# Patient Record
Sex: Male | Born: 1955 | Race: Black or African American | Hispanic: No | Marital: Single | Smoking: Current every day smoker
Health system: Southern US, Community
[De-identification: ages and names within clinical notes are randomized; demographics above are authoritative.]

## PROBLEM LIST (undated history)

## (undated) DIAGNOSIS — M79606 Pain in leg, unspecified: Secondary | ICD-10-CM

## (undated) DIAGNOSIS — M25579 Pain in unspecified ankle and joints of unspecified foot: Secondary | ICD-10-CM

## (undated) DIAGNOSIS — M76829 Posterior tibial tendinitis, unspecified leg: Secondary | ICD-10-CM

## (undated) DIAGNOSIS — M79673 Pain in unspecified foot: Secondary | ICD-10-CM

## (undated) DIAGNOSIS — G8929 Other chronic pain: Secondary | ICD-10-CM

## (undated) HISTORY — PX: LEG SURGERY: SHX1003

---

## 2014-01-05 ENCOUNTER — Emergency Department (HOSPITAL_COMMUNITY)
Admission: EM | Admit: 2014-01-05 | Discharge: 2014-01-05 | Disposition: A | Discharge status: Home / Self Care | Payer: Self-pay | Attending: Emergency Medicine | Admitting: Emergency Medicine

## 2014-01-05 ENCOUNTER — Emergency Department (HOSPITAL_COMMUNITY): Payer: Self-pay

## 2014-01-05 ENCOUNTER — Encounter (HOSPITAL_COMMUNITY): Payer: Self-pay | Admitting: Emergency Medicine

## 2014-01-05 DIAGNOSIS — Y9389 Activity, other specified: Secondary | ICD-10-CM | POA: Insufficient documentation

## 2014-01-05 DIAGNOSIS — S46909A Unspecified injury of unspecified muscle, fascia and tendon at shoulder and upper arm level, unspecified arm, initial encounter: Secondary | ICD-10-CM | POA: Insufficient documentation

## 2014-01-05 DIAGNOSIS — Y929 Unspecified place or not applicable: Secondary | ICD-10-CM | POA: Insufficient documentation

## 2014-01-05 DIAGNOSIS — F17200 Nicotine dependence, unspecified, uncomplicated: Secondary | ICD-10-CM | POA: Insufficient documentation

## 2014-01-05 DIAGNOSIS — IMO0002 Reserved for concepts with insufficient information to code with codable children: Secondary | ICD-10-CM | POA: Insufficient documentation

## 2014-01-05 DIAGNOSIS — S4980XA Other specified injuries of shoulder and upper arm, unspecified arm, initial encounter: Secondary | ICD-10-CM | POA: Insufficient documentation

## 2014-01-05 DIAGNOSIS — Z791 Long term (current) use of non-steroidal anti-inflammatories (NSAID): Secondary | ICD-10-CM | POA: Insufficient documentation

## 2014-01-05 DIAGNOSIS — S0993XA Unspecified injury of face, initial encounter: Secondary | ICD-10-CM | POA: Insufficient documentation

## 2014-01-05 DIAGNOSIS — S199XXA Unspecified injury of neck, initial encounter: Secondary | ICD-10-CM

## 2014-01-05 DIAGNOSIS — M25512 Pain in left shoulder: Secondary | ICD-10-CM

## 2014-01-05 MED ORDER — HYDROCODONE-ACETAMINOPHEN 5-325 MG PO TABS: ORAL_TABLET | ORAL | Status: DC

## 2014-01-05 MED ORDER — ACETAMINOPHEN 325 MG PO TABS
650.0000 mg | ORAL_TABLET | Freq: Once | ORAL | Status: AC
  Administered 2014-01-05: 650 mg via ORAL
  Filled 2014-01-05: qty 2

## 2014-01-05 MED ORDER — NAPROXEN 500 MG PO TABS: 500.0000 mg | ORAL_TABLET | Freq: Two times a day (BID) | ORAL | Status: DC

## 2014-01-05 NOTE — ED Notes (Signed)
Pt c/o neck pain since weedeating on Wednesday, has tried heat packs on neck

## 2014-01-05 NOTE — Discharge Instructions (Signed)
Shoulder Pain The shoulder is the joint that connects your arm to your body. Muscles and band-like tissues that connect bones to muscles (tendons) hold the joint together. Shoulder pain is felt if an injury or medical problem affects one or more parts of the shoulder. HOME CARE   Put ice on the sore area.  Put ice in a plastic bag.  Place a towel between your skin and the bag.  Leave the ice on for 15-20 minutes, 03-04 times a day for the first 2 days.  Stop using cold packs if they do not help with the pain.  If you were given something to keep your shoulder from moving (sling, shoulder immobilizer), wear it as told. Only take it off to shower or bathe.  Move your arm as little as possible, but keep your hand moving to prevent puffiness (swelling).  Squeeze a soft ball or foam pad as much as possible to help prevent swelling.  Take medicine as told by your doctor. GET HELP RIGHT AWAY IF:   Your arm, hand, or fingers are numb or tingling.  Your arm, hand, or fingers are puffy (swollen), painful, or turn white or blue.  You have more pain.  You have progressing new pain in your arm, hand, or fingers.  Your hand or fingers get cold.  Your medicine does not help lessen your pain. MAKE SURE YOU:   Understand these instructions.  Will watch your condition.  Will get help right away if you are not doing well or get worse. Document Released: 01/10/2008 Document Revised: 04/17/2012 Document Reviewed: 02/05/2012 [employer] Patient Information 2014 [employer].  

## 2014-01-05 NOTE — ED Notes (Signed)
Weed eating x2 days ago,weed eater" kicked back", now has swollen left neck, tender upon palpation, painful when raising arm left arm at times

## 2014-01-08 NOTE — ED Provider Notes (Signed)
CSN: [id number]     Arrival date & time 01/05/14  1056 History   First MD Initiated Contact with Patient 01/05/14 1116     Chief Complaint  Patient presents with  . Neck Pain     (Consider location/radiation/quality/duration/timing/severity/associated sxs/prior Treatment) Patient is a 58 y.o. male presenting with neck pain. The history is provided by the patient.  Neck Pain Pain location:  L side Quality:  Aching and stiffness Stiffness is present:  All day Pain radiates to:  L shoulder Pain severity:  Moderate Pain is:  Same all the time Onset quality:  Sudden Duration:  5 days Timing:  Constant Progression:  Improving Chronicity:  New Context: recent injury   Context comment:  Pt reports sudden onset of left neck and shoulder pain after pulling on a "weedeater" to try to crank it and he states it "kicked back" causing a jerking to his arm.   Relieved by:  Heat Exacerbated by: movement of the left arm  Ineffective treatments:  None tried Associated symptoms: no bladder incontinence, no bowel incontinence, no chest pain, no fever, no headaches, no leg pain, no numbness, no paresis, no syncope, no tingling, no visual change and no weakness   Associated symptoms comment:  Denies shortness of breath, dizziness, syncope, or chest pain   History reviewed. No pertinent past medical history. History reviewed. No pertinent past surgical history. History reviewed. No pertinent family history. History  Substance Use Topics  . Smoking status: Current Every Day Smoker    Types: Cigarettes  . Smokeless tobacco: Not on file  . Alcohol Use: Yes    Review of Systems  Constitutional: Negative for fever and chills.  HENT: Negative for facial swelling, sore throat and trouble swallowing.   Respiratory: Negative for cough, chest tightness, shortness of breath and wheezing.   Cardiovascular: Negative for chest pain and syncope.  Gastrointestinal: Negative for bowel incontinence.    Genitourinary: Negative for bladder incontinence, dysuria and difficulty urinating.  Musculoskeletal: Positive for neck pain. Negative for arthralgias, back pain, gait problem and joint swelling.  Skin: Negative for color change, rash and wound.  Neurological: Negative for dizziness, tingling, syncope, weakness, numbness and headaches.  All other systems reviewed and are negative.     Allergies  Aspirin  Home Medications   Prior to Admission medications   Medication Sig Start Date End Date Taking? Authorizing Provider  HYDROcodone-acetaminophen (NORCO/VICODIN) 5-325 MG per tablet Take one-two tabs po q 4-6 hrs prn pain 01/05/14   Balinda Heacock L. Kamri Gotsch, PA-C  naproxen (NAPROSYN) 500 MG tablet Take 1 tablet (500 mg total) by mouth 2 (two) times daily. Take with food 01/05/14   Keauna Brasel L. Princes Finger, PA-C   BP 112/72  Pulse 68  Temp(Src) 98.6 F (37 C) (Oral)  Resp 20  Ht 5\' 11"  (1.803 m)  Wt 155 lb (70.308 kg)  BMI 21.63 kg/m2  SpO2 100% Physical Exam  Nursing note and vitals reviewed. Constitutional: He is oriented to person, place, and time. He appears well-developed and well-nourished. No distress.  HENT:  Head: Normocephalic and atraumatic.  Mouth/Throat: Oropharynx is clear and moist.  Eyes: EOM are normal. Pupils are equal, round, and reactive to light.  Neck: Phonation normal. Normal carotid pulses and no JVD present. Muscular tenderness present. No spinous process tenderness present. Carotid bruit is not present. No rigidity. No erythema and normal range of motion present. No Brudzinski's sign and no Kernig's sign noted. No thyromegaly present.    ttp of the left medial  clavicle, cervical paraspinal muscles and along the left trapezius muscle.  Grip strength is strong and equal bilaterally.  Distal sensation intact,  CR < 2 sec.  Pain to the left neck is reproduced with rotation and palpation.    Cardiovascular: Normal rate, regular rhythm, normal heart sounds and intact distal  pulses.   No murmur heard. Pulmonary/Chest: Effort normal and breath sounds normal. No respiratory distress. He exhibits no tenderness.  Abdominal: Soft. He exhibits no distension. There is no tenderness. There is no rebound.  Musculoskeletal: He exhibits tenderness. He exhibits no edema.       Cervical back: He exhibits tenderness. He exhibits normal range of motion, no bony tenderness, no swelling, no deformity, no spasm and normal pulse.  See neck exam  Lymphadenopathy:    He has no cervical adenopathy.  Neurological: He is alert and oriented to person, place, and time. He has normal strength. No sensory deficit. He exhibits normal muscle tone. Coordination normal.  Reflex Scores:      Tricep reflexes are 2+ on the right side and 2+ on the left side.      Bicep reflexes are 2+ on the right side and 2+ on the left side. Skin: Skin is warm and dry.    ED Course  Procedures (including critical care time) Labs Review Labs Reviewed - No data to display  Imaging Review Dg Shoulder Left  01/05/2014   CLINICAL DATA:  Pain  EXAM: LEFT SHOULDER - 2+ VIEW  COMPARISON:  None.  FINDINGS: Frontal, Y scapular, and axillary images were obtained. There is a small calcification just inferior to the inferior glenoid which may represent a small avulsion type injury. No other evidence of potential fracture. No dislocation. Joint spaces appear intact.  IMPRESSION: Small calcification inferior to the inferior glenoid, concerning for a small avulsion. No other evidence of fracture. Joint spaces appear intact.   Electronically Signed   By: Bretta BangWilliam  Woodruff M.D.   On: 01/05/2014 12:09    EKG Interpretation None      MDM   Final diagnoses:  Shoulder pain, left    Pt with sudden onset of left neck and shoulder pain after work related injury.  Denies CP, dyspnea, numbness, tingling of the UE's, or diaphoresis.  Pt is reproduced with plap and rotation of the neck.  Likely musculoskeletal.  NV intact.  Pt  agrees to symptomatic tx and given strict return precautions and he agrees to plan.  VSS.  Pt appears stable for d/c.  Advised to f/u with PMD as well.      Elliott Lasecki L. Trisha Mangleriplett, PA-C 01/08/14 2033

## 2014-01-12 NOTE — ED Provider Notes (Signed)
Medical screening examination/treatment/procedure(s) were performed by non-physician practitioner and as supervising physician I was immediately available for consultation/collaboration.   EKG Interpretation None       Taiwan Millon R. Joanell Cressler, MD 01/12/14 2336 

## 2014-03-04 ENCOUNTER — Encounter (HOSPITAL_COMMUNITY): Payer: Self-pay | Admitting: Emergency Medicine

## 2014-03-04 ENCOUNTER — Emergency Department (HOSPITAL_COMMUNITY): Payer: Self-pay

## 2014-03-04 ENCOUNTER — Emergency Department (HOSPITAL_COMMUNITY)
Admission: EM | Admit: 2014-03-04 | Discharge: 2014-03-04 | Disposition: A | Discharge status: Home / Self Care | Payer: Self-pay | Attending: Emergency Medicine | Admitting: Emergency Medicine

## 2014-03-04 DIAGNOSIS — Z8781 Personal history of (healed) traumatic fracture: Secondary | ICD-10-CM | POA: Insufficient documentation

## 2014-03-04 DIAGNOSIS — F17200 Nicotine dependence, unspecified, uncomplicated: Secondary | ICD-10-CM | POA: Insufficient documentation

## 2014-03-04 DIAGNOSIS — M79605 Pain in left leg: Secondary | ICD-10-CM

## 2014-03-04 DIAGNOSIS — Z791 Long term (current) use of non-steroidal anti-inflammatories (NSAID): Secondary | ICD-10-CM | POA: Insufficient documentation

## 2014-03-04 DIAGNOSIS — M79609 Pain in unspecified limb: Secondary | ICD-10-CM | POA: Insufficient documentation

## 2014-03-04 MED ORDER — HYDROCODONE-ACETAMINOPHEN 5-325 MG PO TABS: 2.0000 | ORAL_TABLET | ORAL | Status: DC | PRN

## 2014-03-04 NOTE — ED Notes (Signed)
Patient c/o left leg pain that started yesterday. Patient c/o aching that radiates from knee into ankle. Denies any know injury. Patient reports using muscle cream and using hydrocodone that he had left over from shoulder injury with no relief.

## 2014-03-04 NOTE — ED Provider Notes (Signed)
CSN: [id number]     Arrival date & time 03/04/14  1230 History  This chart was scribed for Corey Hutching, MD by Roxy Cedar, ED Scribe. This patient was seen in room APA11/APA11 and the patient's care was started at 1:56 PM.  Chief Complaint  Patient presents with  . Leg Pain   The history is provided by the patient. No language interpreter was used.    HPI Comments: Zerek Lusby is a 58 y.o. male who presents to the Emergency Department complaining of moderate left calf pain, that radiates down to the dorsum of his left foot onset yesterday morning.  He states his LLE is "sore" and that it "hurts". Patient has prior history of a complex fracture in left lower leg from 25 years ago. Patient denies any more repeated injuries.  History reviewed. No pertinent past medical history. Past Surgical History  Procedure Laterality Date  . Leg surgery Left    History reviewed. No pertinent family history. History  Substance Use Topics  . Smoking status: Current Every Day Smoker -- 0.50 packs/day for 25 years    Types: Cigarettes  . Smokeless tobacco: Never Used  . Alcohol Use: Yes     Comment: approx 10-40 oz beers a week     Review of Systems  A complete 10 system review of systems was obtained and all systems are negative except as noted in the HPI and PMH.   Allergies  Aspirin  Home Medications   Prior to Admission medications   Medication Sig Start Date End Date Taking? Authorizing Provider  HYDROcodone-acetaminophen (NORCO/VICODIN) 5-325 MG per tablet Take one-two tabs po q 4-6 hrs prn pain 01/05/14  Yes Tammy L. Triplett, PA-C  naproxen sodium (ANAPROX) 220 MG tablet Take 440 mg by mouth daily as needed (pain).   Yes Historical Provider, MD  HYDROcodone-acetaminophen (NORCO) 5-325 MG per tablet Take 2 tablets by mouth every 4 (four) hours as needed. 03/04/14   Corey Hutching, MD   Triage Vitals: BP 150/84  Pulse 98  Temp(Src) 99.2 F (37.3 C) (Oral)  Resp 18  Ht 5\' 11"  (1.803 m)   Wt 150 lb (68.04 kg)  BMI 20.93 kg/m2  SpO2 100% Physical Exam  Nursing note and vitals reviewed. Constitutional: He is oriented to person, place, and time. He appears well-developed and well-nourished.  HENT:  Head: Normocephalic and atraumatic.  Eyes: Conjunctivae and EOM are normal. Pupils are equal, round, and reactive to light.  Neck: Normal range of motion. Neck supple.  Cardiovascular: Normal rate, regular rhythm and normal heart sounds.   Pulmonary/Chest: Effort normal and breath sounds normal.  Abdominal: Soft. Bowel sounds are normal.  Musculoskeletal: Normal range of motion. He exhibits no tenderness.  Left leg is nontender.  No calf tenderness.  Neurological: He is alert and oriented to person, place, and time.  Skin: Skin is warm and dry.  Psychiatric: He has a normal mood and affect. His behavior is normal.    ED Course  Procedures (including critical care time)  DIAGNOSTIC STUDIES: Oxygen Saturation is 100% on RA, normal by my interpretation.    COORDINATION OF CARE: 1:59 PM- Discussed plans to order diagnostic xray of leg and lab work. Pt advised of plan for treatment and pt agrees.     EKG Interpretation None      MDM   Final diagnoses:  Pain of left lower extremity    Plain films of left tib-fib and left foot negative for fracture. No evidence of a deep  venous thrombosis.  Rx Norco.  I personally performed the services described in this documentation, which was scribed in my presence. The recorded information has been reviewed and is accurate.    Corey HutchingBrian Margart Zemanek, MD 03/04/14 1538

## 2014-03-04 NOTE — Discharge Instructions (Signed)
X-ray showed no fractured bones. Ice pack. Pain medicine. Can also take ibuprofen. Followup with orthopedic doctor if not improving. Phone number given.

## 2014-04-07 ENCOUNTER — Ambulatory Visit (INDEPENDENT_AMBULATORY_CARE_PROVIDER_SITE_OTHER): Payer: Self-pay | Admitting: Orthopedic Surgery

## 2014-04-07 ENCOUNTER — Other Ambulatory Visit: Payer: Self-pay | Admitting: *Deleted

## 2014-04-07 DIAGNOSIS — M6789 Other specified disorders of synovium and tendon, multiple sites: Secondary | ICD-10-CM

## 2014-04-07 DIAGNOSIS — M19079 Primary osteoarthritis, unspecified ankle and foot: Secondary | ICD-10-CM

## 2014-04-07 DIAGNOSIS — M76829 Posterior tibial tendinitis, unspecified leg: Secondary | ICD-10-CM

## 2014-04-07 MED ORDER — HYDROCODONE-ACETAMINOPHEN 10-325 MG PO TABS: 1.0000 | ORAL_TABLET | Freq: Four times a day (QID) | ORAL | Status: DC | PRN

## 2014-04-07 NOTE — Patient Instructions (Signed)
Use these crutches for 1 week AFTER you get the foot orthotics from [place]

## 2014-04-09 ENCOUNTER — Encounter: Payer: Self-pay | Admitting: Orthopedic Surgery

## 2014-04-09 DIAGNOSIS — M76829 Posterior tibial tendinitis, unspecified leg: Secondary | ICD-10-CM | POA: Insufficient documentation

## 2014-04-09 DIAGNOSIS — M19079 Primary osteoarthritis, unspecified ankle and foot: Secondary | ICD-10-CM | POA: Insufficient documentation

## 2014-04-09 NOTE — Progress Notes (Signed)
New patient referral from the emergency room  Chief complaint pain left foot  The patient had open treatment internal fixation of his left ankle several years ago approximately 25 to be exact and presents with painful ambulation with pain along the medial aspect of the ankle malleolus and foot and inability to bear full weight with no history of recent trauma and symptoms present for approximately 2-3 weeks  Review of systems see the record documents  The past, family history and social history have been reviewed and are recorded in the corresponding sections of epic     General the patient grooming and hygiene are acceptable. He has a very small frame skin he then ectomorphic body habitus. Oriented x3 Mood and affect normal Ambulation he ambulates with assistive devices partial weightbearing on the left foot Inspection of the is severe pes planus of his left foot but he can do a sequential single leg heel rise while holding onto a table with no weakness. This pes planus is flexible at the heel. His tenderness is along the posterior tibial tendon. The medial malleolus and lateral malleolus nontender incisions are well-healed and he does have some tenderness at the medial corner of the ankle joint. (See x-ray) Full range of motion All joints are stable Motor exam is normal Skin clean dry and intact  Cardiovascular exam is normal Sensory exam normal   X-ray show internal fixation medial and lateral malleolus with increased arthritic changes noted at the medial portion of the ankle joint. This is the area of maximal tenderness for the patient.  Impression osteoarthritis ankle Posterior tibial tendinitis  The patient is uninsured he cannot afford a Cam Walker I've ordered orthotics for him and gave him pain medication and told to continue using his crutches for one more week after he uses the orthotics and then continue orthotic use.  If he needs additional care he will need referral to  a foot and ankle specialist.

## 2014-05-22 ENCOUNTER — Other Ambulatory Visit: Payer: Self-pay | Admitting: *Deleted

## 2014-05-22 ENCOUNTER — Telehealth: Payer: Self-pay | Admitting: Orthopedic Surgery

## 2014-05-22 MED ORDER — HYDROCODONE-ACETAMINOPHEN 10-325 MG PO TABS: 1.0000 | ORAL_TABLET | Freq: Four times a day (QID) | ORAL | Status: DC | PRN

## 2014-05-22 NOTE — Telephone Encounter (Signed)
Patient is calling asking for a refill on pain medicaiton HYDROcodone-acetaminophen (NORCO) 10-325 MG per tablet please advise?

## 2014-05-25 ENCOUNTER — Other Ambulatory Visit: Payer: Self-pay | Admitting: *Deleted

## 2014-05-25 ENCOUNTER — Other Ambulatory Visit: Payer: Self-pay | Admitting: Orthopedic Surgery

## 2014-05-25 MED ORDER — HYDROCODONE-ACETAMINOPHEN 10-325 MG PO TABS: 1.0000 | ORAL_TABLET | Freq: Four times a day (QID) | ORAL | Status: DC | PRN

## 2014-05-25 NOTE — Telephone Encounter (Signed)
Per Dr Romeo AppleHarrison  Can not have more narcotic meds from me   Refer to foot ankle specialist if hes having trouble   i recommend tylenol arthritis   Called patient, unavailable, left message with mother to have patient call office

## 2014-06-29 ENCOUNTER — Inpatient Hospital Stay (HOSPITAL_COMMUNITY)
Admission: EM | Admit: 2014-06-29 | Discharge: 2014-08-07 | DRG: 299 | Disposition: E | Payer: Medicaid Other | Attending: Pulmonary Disease | Admitting: Pulmonary Disease

## 2014-06-29 ENCOUNTER — Encounter (HOSPITAL_COMMUNITY): Payer: Self-pay | Admitting: Emergency Medicine

## 2014-06-29 DIAGNOSIS — G919 Hydrocephalus, unspecified: Secondary | ICD-10-CM | POA: Diagnosis not present

## 2014-06-29 DIAGNOSIS — R32 Unspecified urinary incontinence: Secondary | ICD-10-CM | POA: Diagnosis not present

## 2014-06-29 DIAGNOSIS — G9340 Encephalopathy, unspecified: Secondary | ICD-10-CM | POA: Insufficient documentation

## 2014-06-29 DIAGNOSIS — N1330 Unspecified hydronephrosis: Secondary | ICD-10-CM | POA: Diagnosis not present

## 2014-06-29 DIAGNOSIS — N182 Chronic kidney disease, stage 2 (mild): Secondary | ICD-10-CM | POA: Diagnosis present

## 2014-06-29 DIAGNOSIS — R630 Anorexia: Secondary | ICD-10-CM | POA: Diagnosis present

## 2014-06-29 DIAGNOSIS — J690 Pneumonitis due to inhalation of food and vomit: Secondary | ICD-10-CM | POA: Diagnosis not present

## 2014-06-29 DIAGNOSIS — D509 Iron deficiency anemia, unspecified: Secondary | ICD-10-CM | POA: Diagnosis present

## 2014-06-29 DIAGNOSIS — Z515 Encounter for palliative care: Secondary | ICD-10-CM

## 2014-06-29 DIAGNOSIS — I82402 Acute embolism and thrombosis of unspecified deep veins of left lower extremity: Secondary | ICD-10-CM

## 2014-06-29 DIAGNOSIS — I129 Hypertensive chronic kidney disease with stage 1 through stage 4 chronic kidney disease, or unspecified chronic kidney disease: Secondary | ICD-10-CM | POA: Diagnosis present

## 2014-06-29 DIAGNOSIS — E43 Unspecified severe protein-calorie malnutrition: Secondary | ICD-10-CM | POA: Diagnosis present

## 2014-06-29 DIAGNOSIS — E876 Hypokalemia: Secondary | ICD-10-CM | POA: Diagnosis not present

## 2014-06-29 DIAGNOSIS — D61818 Other pancytopenia: Secondary | ICD-10-CM

## 2014-06-29 DIAGNOSIS — G8191 Hemiplegia, unspecified affecting right dominant side: Secondary | ICD-10-CM | POA: Diagnosis not present

## 2014-06-29 DIAGNOSIS — Z886 Allergy status to analgesic agent status: Secondary | ICD-10-CM | POA: Diagnosis not present

## 2014-06-29 DIAGNOSIS — R000 Tachycardia, unspecified: Secondary | ICD-10-CM | POA: Diagnosis present

## 2014-06-29 DIAGNOSIS — G8929 Other chronic pain: Secondary | ICD-10-CM | POA: Diagnosis present

## 2014-06-29 DIAGNOSIS — T45515A Adverse effect of anticoagulants, initial encounter: Secondary | ICD-10-CM | POA: Diagnosis not present

## 2014-06-29 DIAGNOSIS — R4020 Unspecified coma: Secondary | ICD-10-CM | POA: Diagnosis not present

## 2014-06-29 DIAGNOSIS — N289 Disorder of kidney and ureter, unspecified: Secondary | ICD-10-CM | POA: Insufficient documentation

## 2014-06-29 DIAGNOSIS — F1010 Alcohol abuse, uncomplicated: Secondary | ICD-10-CM | POA: Diagnosis present

## 2014-06-29 DIAGNOSIS — R312 Other microscopic hematuria: Secondary | ICD-10-CM | POA: Diagnosis present

## 2014-06-29 DIAGNOSIS — M79673 Pain in unspecified foot: Secondary | ICD-10-CM | POA: Diagnosis present

## 2014-06-29 DIAGNOSIS — Z66 Do not resuscitate: Secondary | ICD-10-CM | POA: Diagnosis not present

## 2014-06-29 DIAGNOSIS — Z681 Body mass index (BMI) 19 or less, adult: Secondary | ICD-10-CM | POA: Diagnosis not present

## 2014-06-29 DIAGNOSIS — J9690 Respiratory failure, unspecified, unspecified whether with hypoxia or hypercapnia: Secondary | ICD-10-CM | POA: Insufficient documentation

## 2014-06-29 DIAGNOSIS — N179 Acute kidney failure, unspecified: Secondary | ICD-10-CM | POA: Diagnosis present

## 2014-06-29 DIAGNOSIS — R64 Cachexia: Secondary | ICD-10-CM | POA: Diagnosis present

## 2014-06-29 DIAGNOSIS — R40244 Other coma, without documented Glasgow coma scale score, or with partial score reported: Secondary | ICD-10-CM | POA: Diagnosis present

## 2014-06-29 DIAGNOSIS — I8290 Acute embolism and thrombosis of unspecified vein: Secondary | ICD-10-CM

## 2014-06-29 DIAGNOSIS — I82409 Acute embolism and thrombosis of unspecified deep veins of unspecified lower extremity: Secondary | ICD-10-CM | POA: Diagnosis present

## 2014-06-29 DIAGNOSIS — D696 Thrombocytopenia, unspecified: Secondary | ICD-10-CM | POA: Diagnosis present

## 2014-06-29 DIAGNOSIS — R319 Hematuria, unspecified: Secondary | ICD-10-CM

## 2014-06-29 DIAGNOSIS — R634 Abnormal weight loss: Secondary | ICD-10-CM | POA: Diagnosis present

## 2014-06-29 DIAGNOSIS — I615 Nontraumatic intracerebral hemorrhage, intraventricular: Secondary | ICD-10-CM | POA: Diagnosis not present

## 2014-06-29 DIAGNOSIS — J9600 Acute respiratory failure, unspecified whether with hypoxia or hypercapnia: Secondary | ICD-10-CM | POA: Insufficient documentation

## 2014-06-29 DIAGNOSIS — D649 Anemia, unspecified: Secondary | ICD-10-CM

## 2014-06-29 DIAGNOSIS — Z978 Presence of other specified devices: Secondary | ICD-10-CM

## 2014-06-29 DIAGNOSIS — F17210 Nicotine dependence, cigarettes, uncomplicated: Secondary | ICD-10-CM | POA: Diagnosis present

## 2014-06-29 DIAGNOSIS — M79609 Pain in unspecified limb: Secondary | ICD-10-CM

## 2014-06-29 DIAGNOSIS — F17200 Nicotine dependence, unspecified, uncomplicated: Secondary | ICD-10-CM | POA: Insufficient documentation

## 2014-06-29 DIAGNOSIS — I619 Nontraumatic intracerebral hemorrhage, unspecified: Secondary | ICD-10-CM | POA: Diagnosis present

## 2014-06-29 HISTORY — DX: Pain in unspecified ankle and joints of unspecified foot: M25.579

## 2014-06-29 HISTORY — DX: Other chronic pain: G89.29

## 2014-06-29 HISTORY — DX: Posterior tibial tendinitis, unspecified leg: M76.829

## 2014-06-29 HISTORY — DX: Pain in unspecified foot: M79.673

## 2014-06-29 HISTORY — DX: Pain in leg, unspecified: M79.606

## 2014-06-29 LAB — I-STAT CHEM 8, ED
BUN: 24 mg/dL — AB (ref 6–23)
CALCIUM ION: 1.63 mmol/L — AB (ref 1.13–1.30)
CHLORIDE: 103 meq/L (ref 96–112)
Creatinine, Ser: 1.7 mg/dL — ABNORMAL HIGH (ref 0.50–1.35)
Glucose, Bld: 106 mg/dL — ABNORMAL HIGH (ref 70–99)
HCT: 14 % — ABNORMAL LOW (ref 39.0–52.0)
Hemoglobin: 4.8 g/dL — CL (ref 13.0–17.0)
POTASSIUM: 3.3 meq/L — AB (ref 3.7–5.3)
Sodium: 139 mEq/L (ref 137–147)
TCO2: 25 mmol/L (ref 0–100)

## 2014-06-29 LAB — RETICULOCYTES
RBC.: 1.34 MIL/uL — ABNORMAL LOW (ref 4.22–5.81)
RETIC CT PCT: 3.3 % — AB (ref 0.4–3.1)
Retic Count, Absolute: 44.2 10*3/uL (ref 19.0–186.0)

## 2014-06-29 LAB — CBC WITH DIFFERENTIAL/PLATELET
BASOS PCT: 0 % (ref 0–1)
Basophils Absolute: 0 10*3/uL (ref 0.0–0.1)
EOS PCT: 0 % (ref 0–5)
Eosinophils Absolute: 0 10*3/uL (ref 0.0–0.7)
HCT: 12.8 % — ABNORMAL LOW (ref 39.0–52.0)
Hemoglobin: 3.9 g/dL — CL (ref 13.0–17.0)
LYMPHS ABS: 1 10*3/uL (ref 0.7–4.0)
Lymphocytes Relative: 20 % (ref 12–46)
MCH: 27.7 pg (ref 26.0–34.0)
MCHC: 30.5 g/dL (ref 30.0–36.0)
MCV: 90.8 fL (ref 78.0–100.0)
MONO ABS: 0.5 10*3/uL (ref 0.1–1.0)
MONOS PCT: 9 % (ref 3–12)
NEUTROS PCT: 71 % (ref 43–77)
Neutro Abs: 3.6 10*3/uL (ref 1.7–7.7)
PLATELETS: 91 10*3/uL — AB (ref 150–400)
RBC: 1.41 MIL/uL — AB (ref 4.22–5.81)
RDW: 17.7 % — ABNORMAL HIGH (ref 11.5–15.5)
WBC: 5.1 10*3/uL (ref 4.0–10.5)

## 2014-06-29 LAB — URINALYSIS, ROUTINE W REFLEX MICROSCOPIC
BILIRUBIN URINE: NEGATIVE
Glucose, UA: NEGATIVE mg/dL
KETONES UR: NEGATIVE mg/dL
Nitrite: NEGATIVE
Protein, ur: NEGATIVE mg/dL
Specific Gravity, Urine: 1.015 (ref 1.005–1.030)
UROBILINOGEN UA: 1 mg/dL (ref 0.0–1.0)
pH: 5.5 (ref 5.0–8.0)

## 2014-06-29 LAB — RAPID URINE DRUG SCREEN, HOSP PERFORMED
Amphetamines: NOT DETECTED
BARBITURATES: NOT DETECTED
Benzodiazepines: NOT DETECTED
Cocaine: NOT DETECTED
Opiates: POSITIVE — AB
Tetrahydrocannabinol: NOT DETECTED

## 2014-06-29 LAB — LACTATE DEHYDROGENASE: LDH: 168 U/L (ref 94–250)

## 2014-06-29 LAB — MRSA PCR SCREENING: MRSA by PCR: NEGATIVE

## 2014-06-29 LAB — POC OCCULT BLOOD, ED: Fecal Occult Bld: NEGATIVE

## 2014-06-29 LAB — HEPATITIS PANEL, ACUTE
HCV Ab: NEGATIVE
HEP A IGM: NONREACTIVE
HEP B C IGM: NONREACTIVE
Hepatitis B Surface Ag: NEGATIVE

## 2014-06-29 LAB — PROTIME-INR
INR: 1.43 (ref 0.00–1.49)
PROTHROMBIN TIME: 17.6 s — AB (ref 11.6–15.2)

## 2014-06-29 LAB — URINE MICROSCOPIC-ADD ON

## 2014-06-29 LAB — SAVE SMEAR

## 2014-06-29 LAB — ABO/RH: ABO/RH(D): O NEG

## 2014-06-29 LAB — PREPARE RBC (CROSSMATCH)

## 2014-06-29 LAB — HAPTOGLOBIN: HAPTOGLOBIN: 297 mg/dL — AB (ref 45–215)

## 2014-06-29 LAB — HIV ANTIBODY (ROUTINE TESTING W REFLEX): HIV 1&2 Ab, 4th Generation: NONREACTIVE

## 2014-06-29 MED ORDER — SODIUM CHLORIDE 0.9 % IV SOLN: Freq: Once | INTRAVENOUS | Status: DC

## 2014-06-29 MED ORDER — ONDANSETRON HCL 4 MG/2ML IJ SOLN: 4.0000 mg | Freq: Four times a day (QID) | INTRAMUSCULAR | Status: DC | PRN

## 2014-06-29 MED ORDER — ONDANSETRON HCL 4 MG PO TABS: 4.0000 mg | ORAL_TABLET | Freq: Four times a day (QID) | ORAL | Status: DC | PRN

## 2014-06-29 MED ORDER — HYDROCODONE-ACETAMINOPHEN 5-325 MG PO TABS
1.0000 | ORAL_TABLET | Freq: Once | ORAL | Status: DC
Start: 2014-06-29 — End: 2014-06-29

## 2014-06-29 MED ORDER — HYDROCODONE-ACETAMINOPHEN 5-325 MG PO TABS
1.0000 | ORAL_TABLET | ORAL | Status: DC | PRN
  Administered 2014-06-29: 1 via ORAL
  Administered 2014-06-30 – 2014-07-02 (×4): 2 via ORAL
  Filled 2014-06-29 (×4): qty 2
  Filled 2014-06-29: qty 1

## 2014-06-29 MED ORDER — HYDROCODONE-ACETAMINOPHEN 5-325 MG PO TABS
2.0000 | ORAL_TABLET | Freq: Once | ORAL | Status: AC
  Administered 2014-06-29: 2 via ORAL
  Filled 2014-06-29: qty 2

## 2014-06-29 MED ORDER — SODIUM CHLORIDE 0.9 % IJ SOLN
3.0000 mL | Freq: Two times a day (BID) | INTRAMUSCULAR | Status: DC
  Administered 2014-06-29 – 2014-07-04 (×7): 3 mL via INTRAVENOUS

## 2014-06-29 MED ORDER — INFLUENZA VAC SPLIT QUAD 0.5 ML IM SUSY
0.5000 mL | PREFILLED_SYRINGE | INTRAMUSCULAR | Status: AC
  Administered 2014-06-30: 0.5 mL via INTRAMUSCULAR
  Filled 2014-06-29: qty 0.5

## 2014-06-29 MED ORDER — SODIUM CHLORIDE 0.9 % IV SOLN
10.0000 mL/h | Freq: Once | INTRAVENOUS | Status: AC
  Administered 2014-06-29: 10 mL/h via INTRAVENOUS

## 2014-06-29 NOTE — ED Notes (Signed)
Pt ambulated in hallway with Tech and RN. Pt has unsteady gait.

## 2014-06-29 NOTE — H&P (Signed)
Date: 06/30/2014               Patient Name:  Corey Glenn MRN: [id number]  DOB: 1955/09/14 Age / Sex: 58 y.o., male   PCP: No primary care provider on file.         Medical Service: Internal Medicine Teaching Service         Attending Physician: Dr. Earl LagosNischal Narendra, MD    First Contact: Dr. Allena KatzPatel Pager: [phone]  Second Contact: Dr. Cheryll DessertEmokape Pager: [phone]       After Hours (After 5p/  First Contact Pager: [phone]  weekends / holidays): Second Contact Pager: [phone]   Chief Complaint: right leg pain  History of Present Illness: Corey Glenn is a 58 yo man without known pmh who presents to ED with worsening left leg pain. The patient states that this began about a couple of months ago and he was evaluated by an orthopedist/podiatrist that concluded he had increased leg arch and gave him some pain medications. The patient didn't really improve and therefore the conclusion of chronic pain was made and the orthopedist discontinued his narcotic pain medication within the last few days saying the patient needed to establish with a PCP or pain clinic for chronic narcotics. The patient denied any trauma to the area and that all previous testing he can remember for his leg was negative for fracture or DVT. He states he was in his normal health without any complaints up until the leg pain about 2 weeks. But he has noticed that since that time he has had a weight loss of about 15 lbs within the last month. He denied any shortness or breath, dyspnea on exertion, increased fatigue, any other areas of pain or bone pain, no gross hematuria, no dark tarry stools or hematochezia, and no easy bruising or rashes. 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 He has no known radiation exposure, has had no sick contacts, no recent travel, only been hospitalized  for a fractured/broken left ankle about 25+ years ago. He is a longtime smoker about 12-14 pack year smoker and drinks alcohol daily about 1-2 beers, no other drugs.   Meds: No current facility-administered medications for this encounter.   Current Outpatient Prescriptions  Medication Sig Dispense Refill  . Acetaminophen (TYLENOL PO) Take 1-2 tablets by mouth daily as needed (for pain).    Marland Kitchen. HYDROcodone-acetaminophen (NORCO) 10-325 MG per tablet Take 1 tablet by mouth every 6 (six) hours as needed for moderate pain.      Allergies: Allergies as of 06/12/2014 - Review Complete 06/14/2014  Allergen Reaction Noted  . Aspirin Nausea And Vomiting 01/05/2014   Past Medical History  Diagnosis Date  . Chronic foot pain   . Chronic ankle pain     left  . Posterior tibial tendon dysfunction   . Chronic leg pain     left   Past Surgical History  Procedure Laterality Date  . Leg surgery Left     Open treatment internal fixation left ankle 25 years ago   History reviewed. No pertinent family history. History   Social History  . Marital Status: Single    Spouse Name: N/A    Number of Children: N/A  . Years of Education: N/A   Occupational History  . Not on file.   Social History Main  Topics  . Smoking status: Current Every Day Smoker -- 0.50 packs/day for 25 years    Types: Cigarettes  . Smokeless tobacco: Never Used  . Alcohol Use: Yes     Comment: approx 10-40 oz beers a week   . Drug Use: No  . Sexual Activity: Not on file   Other Topics Concern  . Not on file   Social History Narrative    Review of Systems: Pertinent items are noted in HPI.  Physical Exam: Blood pressure 111/66, pulse 72, temperature 97.8 F (36.6 C), temperature source Oral, resp. rate 17, height 5\' 11"  (1.803 m), weight 150 lb (68.04 kg), SpO2 100 %. General: resting in bed, NAD, cachectic HEENT: PERRL, EOMI, no scleral icterus, senile arcus bilaterally, marked pallor, very poor dentition with  many grossly decayed, temporal wasting Cardiac: tachy with regular rhythm, no rubs, murmurs or gallops Pulm: clear to auscultation bilaterally, no crackles, wheezes, or rhonchi, moving normal volumes of air Abd: soft, nontender, nondistended, no hepatosplenomegaly, BS present Ext: warm and well perfused, no pedal edema, left leg slightly larger than right grossly, 2+ dorsalis pedis pulses, large 8x4 cm mobile soft tissue mass on left shoulder over scapula, no petechiae or rashes Neuro: alert and oriented X3, cranial nerves II-XII grossly intact   Lab results: Basic Metabolic Panel:  Recent Labs  16/10/96 0930  NA 139  K 3.3*  CL 103  GLUCOSE 106*  BUN 24*  CREATININE 1.70*   CBC:  Recent Labs  06/20/2014 0921 07/05/2014 0930  WBC 5.1  --   NEUTROABS 3.6  --   HGB 3.9* 4.8*  HCT 12.8* 14.0*  MCV 90.8  --   PLT 91*  --    Anemia Panel:  Recent Labs  06/28/2014 1100  RETICCTPCT 3.3*   Coagulation:  Recent Labs  06/09/2014 1100  LABPROT 17.6*  INR 1.43   Other results: EKG: normal EKG, normal sinus rhythm, there are no previous tracings available for comparison, ST depression in V6 isolated.  Assessment & Plan by Problem:  1. Occlusive Left leg DVT: Pt with large occlusive DVT as per doppler extending from saphenofemoral to peroneal veins. Pt has not had any hx of DVT and apparently had previously negative evaluation per pt report with an orthopedist. The patient has limited intervention options such as thrombolysis or IVC filter given significant newly found anemia. The whole clinical picture is concerning for an underlying malignancy that may have spawned the DVT it doesn't appear to be DIC picture as pt is not acutely bleeding despite profound anemia.  -obtain outside records -consult Hematology for anticoagulation parameters- recs greatly appreciated  2. Acute Anemia: Pt unknown hx and no labs presented with acute Hgb 3.9, MCV 90 therefore likely something acute. No  signs of active bleeding and FOBT in ED was negative.  -admit to stepdown -ldh, haptoglobin, reticulocyts, blood smear -UDS, UA pending -HIV and hepatitis panel, TSH and HgbA1c pending  -2 units PRBCs with repeat CBC with possible need for 3rd unit  3. Pancytopenia: On CBC with differential pt with mild left shift with some metas (5%) occasional myelocytes and bands makes the situation concerning for a bone marrow process.  -Heme/Onc consult recs greatly appreciated  4. AKI vs CKD: pt has not accessed the medical system. Cr 1.63 unknown baseline.  -IVF and PRBC as above -repeat Cmet in AM -risk stratification labs as above  Dispo: Disposition is deferred at this time, awaiting improvement of current medical problems. Anticipated discharge in  approximately 3-4 day(s).   The patient does not have a current PCP (No primary care provider on file.) and [facility].  The patient does not have transportation limitations that hinder transportation to clinic appointments.  Signed: Christen BameNora Duquan Gillooly, MD 06/22/2014, 12:55 PM

## 2014-06-29 NOTE — ED Notes (Signed)
58 yo male with 3 months of left leg pain. Pain begins in the ankle and shoots up the leg. Hx of left leg fracture in three places 1980 (reports no problems.) 6/10 pain.

## 2014-06-29 NOTE — Progress Notes (Signed)
VASCULAR LAB PRELIMINARY  PRELIMINARY  PRELIMINARY  PRELIMINARY  Bilateral lower extremity venous Dopplers completed.    Preliminary report:  There is acute, occlusive DVT noted in the left saphenofemoral junction, common femoral, femoral, popliteal, posterior tibial, and peroneal veins.  There is no propagation noted to the right lower extremity.   Revecca Nachtigal, RVT 07/06/2014, 8:56 AM

## 2014-06-29 NOTE — ED Notes (Signed)
MD at bedside. Admitting  

## 2014-06-29 NOTE — ED Notes (Signed)
Consent signed.

## 2014-06-29 NOTE — ED Notes (Signed)
MD at bedside. 

## 2014-06-29 NOTE — Plan of Care (Signed)
Problem: Phase I Progression Outcomes Goal: Pain controlled with appropriate interventions Outcome: Progressing Goal: OOB as tolerated unless otherwise ordered Outcome: Progressing Goal: Initial discharge plan identified Outcome: Progressing Goal: Voiding-avoid urinary catheter unless indicated Outcome: Completed/Met Date Met:  06/07/2014 Goal: Hemodynamically stable Outcome: Progressing     

## 2014-06-29 NOTE — ED Notes (Signed)
Pt remains monitored by blood pressure, pulse ox, and 12 lead. Pt asked to provide urine specimen pt stated he could not provide one at this time. Pt provided with urinal.

## 2014-06-29 NOTE — ED Provider Notes (Signed)
CSN: [id number]     Arrival date & time 06/13/2014  [id number] History   First MD Initiated Contact with Patient 06/14/2014 0805     Chief Complaint  Patient presents with  . Leg Pain      HPI Pt was seen at 0805. Per pt, c/o gradual onset and persistence of constant acute flair of his chronic left leg "pain" for the past 3 to 4 months. States the pain starts in his foot and ankle and "shoots up my leg." Describes the pain as "aching." Pt has been evaluated in the ED as well as by Ortho Dr. Romeo AppleHarrison for same, and referred to a foot and ankle specialist. Pt states he has not f/u with his Ortho MD nor obtained f/u with the foot and ankle specialist. Pt states he ran out of his hydrocodone and his Ortho MD "won't give me anymore." Pt came to the ED today requesting pain meds. Denies any injury, no fevers, no rash, no focal motor weakness, no tingling/numbness in extremities, no abd pain, no back pain.    No PMD currently Past Medical History  Diagnosis Date  . Chronic foot pain   . Chronic ankle pain     left  . Posterior tibial tendon dysfunction   . Chronic leg pain     left   Past Surgical History  Procedure Laterality Date  . Leg surgery Left     Open treatment internal fixation left ankle 25 years ago    History  Substance Use Topics  . Smoking status: Current Every Day Smoker -- 0.50 packs/day for 25 years    Types: Cigarettes  . Smokeless tobacco: Never Used  . Alcohol Use: Yes     Comment: approx 10-40 oz beers a week     Review of Systems ROS: Statement: All systems negative except as marked or noted in the HPI; Constitutional: Negative for fever and chills. ; ; Eyes: Negative for eye pain, redness and discharge. ; ; ENMT: Negative for ear pain, hoarseness, nasal congestion, sinus pressure and sore throat. ; ; Cardiovascular: Negative for chest pain, palpitations, diaphoresis, dyspnea and peripheral edema. ; ; Respiratory: Negative for cough, wheezing and stridor. ; ;  Gastrointestinal: Negative for nausea, vomiting, diarrhea, abdominal pain, blood in stool, hematemesis, jaundice and rectal bleeding. . ; ; Genitourinary: Negative for dysuria, flank pain and hematuria. ; ; Musculoskeletal: +chronic left leg pain. Negative for back pain and neck pain. Negative for deformity and trauma.; ; Skin: Negative for pruritus, rash, abrasions, blisters, bruising and skin lesion.; ; Neuro: Negative for headache, lightheadedness and neck stiffness. Negative for weakness, altered level of consciousness , altered mental status, extremity weakness, paresthesias, involuntary movement, seizure and syncope.     Allergies  Aspirin  Home Medications   Prior to Admission medications   Medication Sig Start Date End Date Taking? Authorizing Provider  naproxen sodium (ANAPROX) 220 MG tablet Take 440 mg by mouth daily as needed (pain).    Historical Provider, MD   BP 111/61 mmHg  Pulse 92  Temp(Src) 97.9 F (36.6 C) (Oral)  Resp 16  Ht 5\' 11"  (1.803 m)  Wt 150 lb (68.04 kg)  BMI 20.93 kg/m2  SpO2 100% Physical Exam  0810: Physical examination:  Nursing notes reviewed; Vital signs and O2 SAT reviewed;  Constitutional: Well developed, Well nourished, Well hydrated, In no acute distress; Head:  Normocephalic, atraumatic; Eyes: EOMI, PERRL, No scleral icterus. Conjunctiva pale.; ENMT: Mouth and pharynx normal, Mucous membranes moist; Neck: Supple,  Full range of motion, No lymphadenopathy; Cardiovascular: Regular rate and rhythm, No gallop; Respiratory: Breath sounds clear & equal bilaterally, No rales, rhonchi, wheezes.  Speaking full sentences with ease, Normal respiratory effort/excursion; Chest: Nontender, Movement normal; Abdomen: Soft, Nontender, Nondistended, Normal bowel sounds; Genitourinary: No CVA tenderness; Extremities: Pulses normal, LLE without specific area of point tenderness, well healed surgical wounds left ankle. No deformity, no erythema, no ecchymosis, +tr edema LLE  with mild calf asymmetry. .; Neuro: AA&Ox3, Major CN grossly intact.  Speech clear. No gross focal motor or sensory deficits in extremities.; Skin: Color normal, Warm, Dry.   ED Course  Procedures    EKG Interpretation  Date/Time:  Monday June 29 2014 10:18:15 EST Ventricular Rate:  86 PR Interval:  156 QRS Duration: 76 QT Interval:  330 QTC Calculation: 395 R Axis:   55 Text Interpretation:  Sinus rhythm Baseline wander No old tracing to compare Confirmed by [facility]  MD, Nicholos JohnsKATHLEEN [phone]([id number]) on 06/10/2014 12:08:28 PM          MDM  MDM Reviewed: previous chart, nursing note and vitals Reviewed previous: x-ray Interpretation: ultrasound and labs Total time providing critical care: 30-74 minutes. This excludes time spent performing separately reportable procedures and services. Consults: admitting MD   CRITICAL CARE Performed by: Laray AngerMCMANUS,Makailah Slavick M Total critical care time: 35 Critical care time was exclusive of separately billable procedures and treating other patients. Critical care was necessary to treat or prevent imminent or life-threatening deterioration. Critical care was time spent personally by me on the following activities: development of treatment plan with patient and/or surrogate as well as nursing, discussions with consultants, evaluation of patient's response to treatment, examination of patient, obtaining history from patient or surrogate, ordering and performing treatments and interventions, ordering and review of laboratory studies, ordering and review of radiographic studies, pulse oximetry and re-evaluation of patient's condition.    VASCULAR LAB PRELIMINARY PRELIMINARY PRELIMINARY PRELIMINARY Bilateral lower extremity venous Dopplers completed.  Preliminary report: There is acute, occlusive DVT noted in the left saphenofemoral junction, common femoral, femoral, popliteal, posterior tibial, and peroneal veins. There is no propagation noted to the  right lower extremity.  KANADY, CANDACE, RVT 06/24/2014, 8:56 AM   Results for orders placed or performed during the hospital encounter of 07/02/2014  CBC with Differential  Result Value Ref Range   WBC 5.1 4.0 - 10.5 K/uL   RBC 1.41 (L) 4.22 - 5.81 MIL/uL   Hemoglobin 3.9 (LL) 13.0 - 17.0 g/dL   HCT 78.212.8 (L) 95.639.0 - 21.352.0 %   MCV 90.8 78.0 - 100.0 fL   MCH 27.7 26.0 - 34.0 pg   MCHC 30.5 30.0 - 36.0 g/dL   RDW 08.617.7 (H) 57.811.5 - 46.915.5 %   Platelets 91 (L) 150 - 400 K/uL   Neutrophils Relative % 71 43 - 77 %   Lymphocytes Relative 20 12 - 46 %   Monocytes Relative 9 3 - 12 %   Eosinophils Relative 0 0 - 5 %   Basophils Relative 0 0 - 1 %   Neutro Abs 3.6 1.7 - 7.7 K/uL   Lymphs Abs 1.0 0.7 - 4.0 K/uL   Monocytes Absolute 0.5 0.1 - 1.0 K/uL   Eosinophils Absolute 0.0 0.0 - 0.7 K/uL   Basophils Absolute 0.0 0.0 - 0.1 K/uL   RBC Morphology POLYCHROMASIA PRESENT    WBC Morphology MILD LEFT SHIFT (1-5% METAS, OCC MYELO, OCC BANDS)   I-stat Chem 8, ED  Result Value Ref Range   Sodium  139 137 - 147 mEq/L   Potassium 3.3 (L) 3.7 - 5.3 mEq/L   Chloride 103 96 - 112 mEq/L   BUN 24 (H) 6 - 23 mg/dL   Creatinine, Ser 4.09 (H) 0.50 - 1.35 mg/dL   Glucose, Bld 811 (H) 70 - 99 mg/dL   Calcium, Ion 9.14 (H) 1.13 - 1.30 mmol/L   TCO2 25 0 - 100 mmol/L   Hemoglobin 4.8 (LL) 13.0 - 17.0 g/dL   HCT 78.2 (L) 95.6 - 21.3 %   Comment NOTIFIED PHYSICIAN   POC occult blood, ED  Result Value Ref Range   Fecal Occult Bld NEGATIVE NEGATIVE  Type and screen  Result Value Ref Range   ABO/RH(D) O NEG    Antibody Screen NEG    Sample Expiration 07/02/2014    Unit Number [id number]    Blood Component Type RBC LR PHER2    Unit division 00    Status of Unit ALLOCATED    Transfusion Status OK TO TRANSFUSE    Crossmatch Result Compatible    Unit Number [id number]    Blood Component Type RBC LR PHER2    Unit division 00    Status of Unit ALLOCATED    Transfusion Status OK TO TRANSFUSE     Crossmatch Result Compatible   Prepare RBC  Result Value Ref Range   Order Confirmation ORDER PROCESSED BY BLOOD BANK   ABO/Rh  Result Value Ref Range   ABO/RH(D) O NEG      1040:  Pt with acute LLE DVT, but unable to anticoagulate due to Hgb 3.9 and plts 91. Stool is heme negative. Will start PRBC transfusion, admit. T/C to [facility] Resident, case discussed, including:  HPI, pertinent PM/SHx, VS/PE, dx testing, ED course and treatment:  Agreeable to admit, requests to write temporary orders, obtain stepdown bed to Dr. Charlean Sanfilippo service.   Samuel Jester, DO 07/02/14 1406

## 2014-06-29 NOTE — ED Notes (Signed)
Ambulated patient to hallway and back to bed per Dr. orders.

## 2014-06-29 NOTE — ED Notes (Signed)
Chem 8 results given to Dr. McManus 

## 2014-06-29 NOTE — ED Notes (Signed)
Critical Lab reported to EDP  

## 2014-06-29 NOTE — Progress Notes (Signed)
Pt just got to floor from ED. Central telemetry notified. Pt is alert and oriented. Pt states no pain. Vitals within normal limits. CHG given. MRSA swab complete in nares.

## 2014-06-29 NOTE — ED Notes (Signed)
MD at bedside; admitting

## 2014-06-29 NOTE — Progress Notes (Signed)
Pt receiving one unit of blood. Pt has had WNL vital signs. Pt has had no pain and no reaction to blood. Passing on to night nurse.

## 2014-06-29 NOTE — Progress Notes (Signed)
Pt has a blood clot in left leg. Unable to place SCD's on d/t blood clot and swelling of legs. Notified Dr. Hadley PenEjiroghene and she stated to hold SCD's at this time.

## 2014-06-30 DIAGNOSIS — D61818 Other pancytopenia: Secondary | ICD-10-CM

## 2014-06-30 DIAGNOSIS — D508 Other iron deficiency anemias: Secondary | ICD-10-CM

## 2014-06-30 DIAGNOSIS — D509 Iron deficiency anemia, unspecified: Secondary | ICD-10-CM

## 2014-06-30 DIAGNOSIS — E43 Unspecified severe protein-calorie malnutrition: Secondary | ICD-10-CM | POA: Insufficient documentation

## 2014-06-30 LAB — CBC WITH DIFFERENTIAL/PLATELET
Basophils Absolute: 0 10*3/uL (ref 0.0–0.1)
Basophils Absolute: 0 K/uL (ref 0.0–0.1)
Basophils Relative: 0 % (ref 0–1)
Basophils Relative: 0 % (ref 0–1)
Eosinophils Absolute: 0 10*3/uL (ref 0.0–0.7)
Eosinophils Absolute: 0.1 K/uL (ref 0.0–0.7)
Eosinophils Relative: 1 % (ref 0–5)
Eosinophils Relative: 1 % (ref 0–5)
HCT: 20.7 % — ABNORMAL LOW (ref 39.0–52.0)
HEMATOCRIT: 17 % — AB (ref 39.0–52.0)
HEMOGLOBIN: 5.6 g/dL — AB (ref 13.0–17.0)
Hemoglobin: 6.8 g/dL — CL (ref 13.0–17.0)
LYMPHS PCT: 22 % (ref 12–46)
Lymphocytes Relative: 22 % (ref 12–46)
Lymphs Abs: 1.2 10*3/uL (ref 0.7–4.0)
Lymphs Abs: 1.3 K/uL (ref 0.7–4.0)
MCH: 29 pg (ref 26.0–34.0)
MCH: 28.8 pg (ref 26.0–34.0)
MCHC: 32.9 g/dL (ref 30.0–36.0)
MCHC: 32.9 g/dL (ref 30.0–36.0)
MCV: 88.1 fL (ref 78.0–100.0)
MCV: 87.7 fL (ref 78.0–100.0)
MONO ABS: 0.5 10*3/uL (ref 0.1–1.0)
MONOS PCT: 9 % (ref 3–12)
Monocytes Absolute: 0.5 K/uL (ref 0.1–1.0)
Monocytes Relative: 9 % (ref 3–12)
NEUTROS ABS: 3.7 10*3/uL (ref 1.7–7.7)
Neutro Abs: 3.8 K/uL (ref 1.7–7.7)
Neutrophils Relative %: 69 % (ref 43–77)
Neutrophils Relative %: 68 % (ref 43–77)
Platelets: 84 10*3/uL — ABNORMAL LOW (ref 150–400)
Platelets: 78 K/uL — ABNORMAL LOW (ref 150–400)
RBC: 1.93 MIL/uL — ABNORMAL LOW (ref 4.22–5.81)
RBC: 2.36 MIL/uL — ABNORMAL LOW (ref 4.22–5.81)
RDW: 18.5 % — ABNORMAL HIGH (ref 11.5–15.5)
RDW: 18.5 % — ABNORMAL HIGH (ref 11.5–15.5)
WBC: 5.4 10*3/uL (ref 4.0–10.5)
WBC: 5.7 K/uL (ref 4.0–10.5)

## 2014-06-30 LAB — CBC
HCT: 23.1 % — ABNORMAL LOW (ref 39.0–52.0)
HCT: 22.6 % — ABNORMAL LOW (ref 39.0–52.0)
Hemoglobin: 7.6 g/dL — ABNORMAL LOW (ref 13.0–17.0)
Hemoglobin: 7.6 g/dL — ABNORMAL LOW (ref 13.0–17.0)
MCH: 28.8 pg (ref 26.0–34.0)
MCH: 29 pg (ref 26.0–34.0)
MCHC: 32.9 g/dL (ref 30.0–36.0)
MCHC: 33.6 g/dL (ref 30.0–36.0)
MCV: 87.5 fL (ref 78.0–100.0)
MCV: 86.3 fL (ref 78.0–100.0)
PLATELETS: 91 10*3/uL — AB (ref 150–400)
Platelets: 86 10*3/uL — ABNORMAL LOW (ref 150–400)
RBC: 2.64 MIL/uL — AB (ref 4.22–5.81)
RBC: 2.62 MIL/uL — AB (ref 4.22–5.81)
RDW: 18.1 % — ABNORMAL HIGH (ref 11.5–15.5)
RDW: 17.4 % — AB (ref 11.5–15.5)
WBC: 6.2 10*3/uL (ref 4.0–10.5)
WBC: 5.8 10*3/uL (ref 4.0–10.5)

## 2014-06-30 LAB — PREPARE RBC (CROSSMATCH)

## 2014-06-30 LAB — HEPARIN LEVEL (UNFRACTIONATED)
Heparin Unfractionated: <0.1 IU/mL — ABNORMAL LOW (ref 0.30–0.70)
Heparin Unfractionated: <0.1 [IU]/mL — ABNORMAL LOW (ref 0.30–0.70)

## 2014-06-30 MED ORDER — SODIUM CHLORIDE 0.9 % IV SOLN: Freq: Once | INTRAVENOUS | Status: AC

## 2014-06-30 MED ORDER — PEG-KCL-NACL-NASULF-NA ASC-C 100 G PO SOLR
0.5000 | Freq: Once | ORAL | Status: AC
  Administered 2014-06-30: 100 g via ORAL
  Filled 2014-06-30: qty 1

## 2014-06-30 MED ORDER — BOOST / RESOURCE BREEZE PO LIQD
1.0000 | Freq: Three times a day (TID) | ORAL | Status: DC
  Administered 2014-06-30 – 2014-07-01 (×3): 1 via ORAL

## 2014-06-30 MED ORDER — SODIUM CHLORIDE 0.9 % IV SOLN
Freq: Once | INTRAVENOUS | Status: AC
  Administered 2014-06-30: 16:00:00 via INTRAVENOUS

## 2014-06-30 MED ORDER — BISACODYL 5 MG PO TBEC
5.0000 mg | DELAYED_RELEASE_TABLET | Freq: Once | ORAL | Status: AC
  Administered 2014-07-01: 5 mg via ORAL
  Filled 2014-06-30: qty 1

## 2014-06-30 MED ORDER — HEPARIN (PORCINE) IN NACL 100-0.45 UNIT/ML-% IJ SOLN
1500.0000 [IU]/h | INTRAMUSCULAR | Status: DC
  Administered 2014-06-30: 700 [IU]/h via INTRAVENOUS
  Administered 2014-07-01: 1050 [IU]/h via INTRAVENOUS
  Administered 2014-07-02: 1500 [IU]/h via INTRAVENOUS
  Filled 2014-06-30 (×7): qty 250

## 2014-06-30 MED ORDER — SODIUM CHLORIDE 0.9 % IV SOLN
Freq: Once | INTRAVENOUS | Status: AC
  Administered 2014-06-30: 01:00:00 via INTRAVENOUS

## 2014-06-30 MED ORDER — PEG-KCL-NACL-NASULF-NA ASC-C 100 G PO SOLR
1.0000 | Freq: Once | ORAL | Status: DC
Start: 2014-06-30 — End: 2014-06-30

## 2014-06-30 MED ORDER — BISACODYL 5 MG PO TBEC
5.0000 mg | DELAYED_RELEASE_TABLET | Freq: Once | ORAL | Status: AC
  Administered 2014-06-30: 5 mg via ORAL
  Filled 2014-06-30: qty 1

## 2014-06-30 MED ORDER — PEG-KCL-NACL-NASULF-NA ASC-C 100 G PO SOLR
0.5000 | Freq: Once | ORAL | Status: AC
  Administered 2014-07-01: 100 g via ORAL

## 2014-06-30 NOTE — Progress Notes (Addendum)
INITIAL NUTRITION ASSESSMENT  DOCUMENTATION CODES Per approved criteria  -Severe malnutrition in the context of chronic illness -Underweight   INTERVENTION: Resource Breeze po TID, each supplement provides 250 kcal and 9 grams of protein RD to follow for nutrition care plan  NUTRITION DIAGNOSIS: Increased nutrient needs related to malnutrition, repletion as evidenced by estimated nutrition needs  Goal: Pt to meet >/= 90% of their estimated nutrition needs   Monitor:  PO & supplemental intake, weight, labs, I/O's  Reason for Assessment: Malnutrition Screening Tool Report  [age] y.o. male  Admitting Dx: Anemia  ASSESSMENT: 58 y.o. Male with PMH of chronic foot pain, posterior tibial tendon dysfunction; presented to ED for evaluation of pain in his left leg; diagnosed in ED with extensive LLE DVT.   GI consulted for iron deficiency anemia; note reviewed.  Pt reports a decreased appetite for the past 2 weeks; he endorses weight loss, however, unable to quantify amount; per wt readings below, pt has had a 14% weight loss since June 2015 (severe for time frame); PO intake 75% per flowsheet records; noted hx of fairly heavy ETOH use; would benefit from oral nutrition supplements; RD to order.  Nutrition Focused Physical Exam:  Subcutaneous Fat:  Orbital Region: N/A Upper Arm Region: moderate-severe depletion Thoracic and Lumbar Region: N/A  Muscle:  Temple Region: mild-moderate depletion  Clavicle Bone Region: moderate-severe depletion Clavicle and Acromion Bone Region: moderate-severe depletion Scapular Bone Region: N/A Dorsal Hand: N/A Patellar Region: moderate-severe depletion Anterior Thigh Region: moderate-severe depletion Posterior Calf Region: moderate-severe depletion  Edema: none  Patient meets criteria for severe malnutrition in the context of chronic illness as evidenced by severe weight loss of 14% in < 6 months and moderate-severe muscle/subcutaneous fat  loss.  Height: Ht Readings from Last 1 Encounters:  06/26/2014 5\' 11"  (1.803 m)    Weight: Wt Readings from Last 1 Encounters:  06/16/2014 129 lb 10.1 oz (58.8 kg)    Ideal Body Weight: 172 lb  % Ideal Body Weight: 75%  Wt Readings from Last 10 Encounters:  06/24/2014 129 lb 10.1 oz (58.8 kg)  03/04/14 150 lb (68.04 kg)  01/05/14 155 lb (70.308 kg)    Usual Body Weight: 150 lb -- June 2015  % Usual Body Weight: 86%  BMI:  Body mass index is 18.09 kg/(m^2).  Estimated Nutritional Needs: Kcal: 1800-2000 Protein: 90-100 gm Fluid: 1.8-2.0 L  Skin: Intact  Diet Order: Clear Liquids  EDUCATION NEEDS: -No education needs identified at this time   Intake/Output Summary (Last 24 hours) at 06/30/14 1517 Last data filed at 06/30/14 1000  Gross per 24 hour  Intake   1006 ml  Output    600 ml  Net    406 ml    Labs:   Recent Labs Lab 07/01/2014 0930  NA 139  K 3.3*  CL 103  BUN 24*  CREATININE 1.70*  GLUCOSE 106*    Scheduled Meds: . sodium chloride   Intravenous Once  . sodium chloride   Intravenous Once  . sodium chloride   Intravenous Once  . sodium chloride  3 mL Intravenous Q12H    Continuous Infusions: . heparin 700 Units/hr (06/30/14 16100618)    Past Medical History  Diagnosis Date  . Chronic foot pain   . Chronic ankle pain     left  . Posterior tibial tendon dysfunction   . Chronic leg pain     left    Past Surgical History  Procedure Laterality Date  . Leg  surgery Left     Open treatment internal fixation left ankle 25 years ago    Maureen ChattersKatie David Tow[place], [place]RD, LDN Pager #: [phone] After-Hours Pager #: [phone]

## 2014-06-30 NOTE — Consult Note (Signed)
[facility] Consult: 1:54 PM 06/30/2014  LOS: 1 day    Referring Provider: Dr Heide SparkNarendra Primary Care Physician:  None Primary Gastroenterologist:   Gentry FitzUnassigned     Reason for Consultation:  Iron def anemia, FOBT negative.    HPI: Corey Glenn is a 58 y.o. male.  Remote left leg fx surgery 20 yrs ago.  Chronic left leg, foot and ankle pain.  He does not have any regular medical care.  Presented to ED yesterday for eval of pain in left  leg. Previously advised by an orthopedic MD that he had "problem with the arch of the foot" causing foot/ankle pain. This same MD had been prescribing hydrocodone but he ran out of this about 3 or 4 weeks ago.  In ED was diagnosed with extensive LLE DVT Pt also significantly anemic with Hgb of 3.9.  Although MCV is 88 and iron/anemia studies are all pending, Dr Cyndie ChimeGranfortuna feels this is iron deficiency.  Pt is FOBT negative.  Platelets also low at 78.  PT/INR is  17.6/1.4.  Is getting his 4th unit of PRBC now, Hgb after 3 was 6.8.  Has not had LFT testing but acute hepatitis ABC and HIV testing is all negative.  Has CKD by labs.  Has RBCs and WBCs in urine.   Has never had EGD, colonoscopy. Has lost 15 # in last few months.  Diminished appetite for 2 weeks.  Previously BMs were a daily occurrence. However in the last 2 weeks they are occurring every other day. There is no visible blood in the stool and no melena. He has infrequent periodic dysphagia occurring at the level of the pharynx. He doesn't get much heartburn. He has taken Aleve and ibuprofen in the past but none on a regular basis and non-for a couple of weeks. He quit drinking beer 4 months ago when he lost his taste for it. Previously he will drink about a sixpack of beer daily. Patient has been needing to use crutches  because of the pain in his leg. Thus he has not been as ambulatory as is normal for him, which involves regular longer distance walking. However he is not describing shortness of breath, palpitations or tachycardia with exertion. He has not been dizzy. He denies unusual bleeding or bruising tendencies. Although the left leg has recently swollen, he has not had any abdominal swelling.   Past Medical History  Diagnosis Date  . Chronic foot pain   . Chronic ankle pain     left  . Posterior tibial tendon dysfunction   . Chronic leg pain     left    Past Surgical History  Procedure Laterality Date  . Leg surgery Left     Open treatment internal fixation left ankle 25 years ago    Prior to Admission medications   Medication Sig Start Date End Date Taking? Authorizing Provider  Acetaminophen (TYLENOL PO) Take 1-2 tablets by mouth daily as needed (for pain).   Yes Historical Provider, MD  HYDROcodone-acetaminophen (NORCO) 10-325 MG per tablet Take 1 tablet by mouth  every 6 (six) hours as needed for moderate pain.   Yes Historical Provider, MD    Scheduled Meds: . sodium chloride   Intravenous Once  . sodium chloride   Intravenous Once  . sodium chloride   Intravenous Once  . sodium chloride  3 mL Intravenous Q12H   Infusions: . heparin 700 Units/hr (06/30/14 0618)   PRN Meds: HYDROcodone-acetaminophen, ondansetron **OR** ondansetron (ZOFRAN) IV   Allergies as of 06/25/2014 - Review Complete 06/28/2014  Allergen Reaction Noted  . Aspirin Nausea And Vomiting 01/05/2014    History reviewed. No pertinent family history.  History   Social History  . Marital Status: Single    Spouse Name: N/A    Number of Children: N/A  . Years of Education: N/A   Occupational History  . Not on file.   Social History Main Topics  . Smoking status: Current Every Day Smoker -- 0.50 packs/day for 25 years    Types: Cigarettes  . Smokeless tobacco: Never Used  . Alcohol Use: Yes      Comment: approx 10-40 oz beers a week   . Drug Use: No  . Sexual Activity: Not on file   Other Topics Concern  . Not on file   Social History Narrative    REVIEW OF SYSTEMS: Constitutional:  Per history of present illness ENT:  No nose bleeds.  No congestion or discharge Pulm:  Per history of present illness. No cough. CV:  No palpitations, no LE edema.  GU:  Patient has never seen blood in the urine and does not describe regular episodes of nocturia, frequency or dysuria GI:  Per history of present illness Heme:  Never previously advised that he had low blood counts but then he never goes to the doctor   Transfusions:  Has had 3 units of blood so far and a fourth has been ordered Neuro:  No headaches, no peripheral tingling or numbness Derm:  No itching, no rash or sores.  Endocrine:  No sweats or chills.  No polyuria or dysuria Immunization:  Did not inquire Travel:  None beyond local counties in last few months.    PHYSICAL EXAM: Vital signs in last 24 hours: Filed Vitals:   06/30/14 1325  BP: 120/61  Pulse: 88  Temp: 98.6 F (37 C)  Resp: 17   Wt Readings from Last 3 Encounters:  06/27/2014 129 lb 10.1 oz (58.8 kg)  03/04/14 150 lb (68.04 kg)  01/05/14 155 lb (70.308 kg)   General: Cachectic, not acutely ill appearing AAM.  He is comfortable Head:  No swelling, no signs of trauma, no asymmetry  Eyes:  No icterus, no pallor, EOMI Ears:  Not hard of hearing  Nose:  No congestion or discharge Mouth:  Poor dentition. Mucous membranes clear, moist and without lesions. Neck:  No JVD, no TMJ Lungs:  Clear bilaterally. No dyspnea, no cough Heart: RRR. Soft 1/6 systolic murmur Abdomen:  Soft, nontender, nondistended. There is some slight fullness in the right upper quadrant but no distinct mass. Liver is not palpable.   Rectal: Deferred   Musc/Skeltl: No joint deformities or contractures. Extremities:  There is swelling in the left lower leg.  Neurologic:  No tremor, no  gross deficits. Patient is alert and oriented 3. He is a good historian Skin:  No AVMs, no rashes, no sores. Tattoos:  None seen Nodes:  No cervical adenopathy.   Psych:  Pleasant, cooperative.  Intake/Output from previous day: 11/23 0701 - 11/24 0700 In: 1349 [  I.V.:413; Blood:936] Out: 675 [Urine:675] Intake/Output this shift: Total I/O In: 240 [P.O.:240] Out: 200 [Urine:200]  LAB RESULTS:  Recent Labs  07/06/2014 0921 06/16/2014 0930 06/15/2014 2322 06/30/14 0705  WBC 5.1  --  5.4 5.7  HGB 3.9* 4.8* 5.6* 6.8*  HCT 12.8* 14.0* 17.0* 20.7*  PLT 91*  --  84* 78*   BMET Lab Results  Component Value Date   NA 139 06/30/2014   K 3.3* 06/10/2014   CL 103 06/27/2014   GLUCOSE 106* 06/27/2014   BUN 24* 07/05/2014   CREATININE 1.70* 06/13/2014   LFT No results for input(s): PROT, ALBUMIN, AST, ALT, ALKPHOS, BILITOT, BILIDIR, IBILI in the last 72 hours. PT/INR Lab Results  Component Value Date   INR 1.43 06/22/2014   Hepatitis Panel  Recent Labs  06/27/2014 1100  HEPBSAG NEGATIVE  HCVAB NEGATIVE  HEPAIGM NON REACTIVE  HEPBIGM NON REACTIVE   C-Diff No components found for: CDIFF Lipase  No results found for: LIPASE  Drugs of Abuse     Component Value Date/Time   LABOPIA POSITIVE* 06/17/2014 1355   COCAINSCRNUR NONE DETECTED 07/02/2014 1355   LABBENZ NONE DETECTED 06/19/2014 1355   AMPHETMU NONE DETECTED 06/28/2014 1355   THCU NONE DETECTED 06/30/2014 1355   LABBARB NONE DETECTED 06/23/2014 1355     ENDOSCOPIC STUDIES:   IMPRESSION:   *  Normocytic anemia. Although we are awaiting iron and anemia studies, Dr. Cyndie ChimeGranfortuna feels this is iron deficiency. On a single POC test he is FOBT negative.  At in age of 58 he should undergo colonoscopy, and given his recent anorexia and weight loss should also undergo upper endoscopy. Patient is willing to undergo these tests.  Not sure about the timing; given his need to remain non-ambulatory it may be difficult for him  to complete a bowel prep.  *  Extensive left lower extremity DVT. He is certainly going to require anticoagulation long-term and is currently receiving IV heparin.  Having a spontaneous DVT raises the question of some sort of occult malignancy which leads back to the need for evaluation of his digestive tract for possible neoplastic lesions.   *  Microscopic hematuria.    *  Renal insufficiency.     PLAN:     *  Could arrange for colonoscopy and EGD as soon as tomorrow with a split dose perhaps starting this evening.  Attending medical team approves pt for studies and prep so arranged studies for tomorrow late AM.   *  I ordered some LFTs for tomorrow. Patient does have a history of fairly heavy alcohol use but has been abstinent for several months.  Also ordered a TSH.   Jennye MoccasinSarah Gribbin  06/30/2014, 1:54 PM Pager: [phone]  GI Attending Note   Chart was reviewed and patient was examined. X-rays and lab were reviewed.    I agree with management and plans.  A GI bleeding source should be ruled out in the face of a possible iron deficiency anemia and anticipated anticoagulation therapy.  Patient may also have chronic liver disease which may explain his thrombocytopenia and could be contracting to his anemia.  Recommendations - in addition to GI studies, would proceed with CT the abdomen and pelvis.  Check LFTs.  Barbette Hairobert D. Arlyce DiceKaplan, M.D., [facility] Center[facility]ACG [facility] [phone]

## 2014-06-30 NOTE — Clinical Social Work Note (Signed)
CSW spoke with patient concerning consult for advanced directive.  Patient stated that his brother, Ferne ReusRon, works in the hospital and the patient would like him present to fill out the advanced directive.  CSW spoke with patients brother who stated that he will take care of everything for the advanced directive and does not need CSW assistance at this time.  CSW signing off.  Merlyn LotJenna Holoman, LCSWA Clinical Social Worker [phone]

## 2014-06-30 NOTE — Progress Notes (Signed)
Subjective: This AM, he has no complaints and was watching TV on exam. He received pRBC overnight, Hb now 6.8.   Objective: Vital signs in last 24 hours: Filed Vitals:   06/30/14 0215 06/30/14 0400 06/30/14 0521 06/30/14 0816  BP: 120/73  125/70 130/64  Pulse:   66 75  Temp: 98.2 F (36.8 C) 98 F (36.7 C) 98.2 F (36.8 C) 98.1 F (36.7 C)  TempSrc: Oral Oral Oral Oral  Resp:   22 16  Height:      Weight:      SpO2:   100% 100%   Weight change:   Intake/Output Summary (Last 24 hours) at 06/30/14 1250 Last data filed at 06/30/14 1000  Gross per 24 hour  Intake   1589 ml  Output    875 ml  Net    714 ml   General: resting in bed, NAD HEENT: PERRL, EOMI, no scleral icterus, oropharynx clear Cardiac: RRR, no rubs, murmurs or gallops Pulm: clear to auscultation bilaterally, no wheezes, rales, or rhonchi Abd: soft, nontender, nondistended, BS present Ext: L > R though without erythema, inflammation Neuro: responds to questions appropriately; moving all extremities freely   Lab Results: Basic Metabolic Panel:  Recent Labs Lab 06/18/2014 0930  NA 139  K 3.3*  CL 103  GLUCOSE 106*  BUN 24*  CREATININE 1.70*   Liver Function Tests: No results for input(s): AST, ALT, ALKPHOS, BILITOT, PROT, ALBUMIN in the last 168 hours. No results for input(s): LIPASE, AMYLASE in the last 168 hours. No results for input(s): AMMONIA in the last 168 hours. CBC:  Recent Labs Lab 07/05/2014 2322 06/30/14 0705  WBC 5.4 5.7  NEUTROABS 3.7 3.8  HGB 5.6* 6.8*  HCT 17.0* 20.7*  MCV 88.1 87.7  PLT 84* 78*   Coagulation:  Recent Labs Lab 06/28/2014 1100  LABPROT 17.6*  INR 1.43   Anemia Panel:  Recent Labs Lab 06/20/2014 1100  RETICCTPCT 3.3*   Urine Drug Screen: Drugs of Abuse     Component Value Date/Time   LABOPIA POSITIVE* 07/05/2014 1355   COCAINSCRNUR NONE DETECTED 07/05/2014 1355   LABBENZ NONE DETECTED 06/10/2014 1355   AMPHETMU NONE DETECTED 06/10/2014 1355   THCU NONE DETECTED 06/08/2014 1355   LABBARB NONE DETECTED 06/13/2014 1355    Urinalysis:  Recent Labs Lab 06/07/2014 1355  COLORURINE YELLOW  LABSPEC 1.015  PHURINE 5.5  GLUCOSEU NEGATIVE  HGBUR LARGE*  BILIRUBINUR NEGATIVE  KETONESUR NEGATIVE  PROTEINUR NEGATIVE  UROBILINOGEN 1.0  NITRITE NEGATIVE  LEUKOCYTESUR TRACE*     Micro Results: Recent Results (from the past 240 hour(s))  MRSA PCR Screening     Status: None   Collection Time: 06/10/2014  3:01 PM  Result Value Ref Range Status   MRSA by PCR NEGATIVE NEGATIVE Final    Comment:        The GeneXpert MRSA Assay (FDA approved for NASAL specimens only), is one component of a comprehensive MRSA colonization surveillance program. It is not intended to diagnose MRSA infection nor to guide or monitor treatment for MRSA infections.    Studies/Results: No results found. Medications: I have reviewed the patient's current medications. Scheduled Meds: . sodium chloride   Intravenous Once  . sodium chloride  3 mL Intravenous Q12H   Continuous Infusions: . heparin 700 Units/hr (06/30/14 0618)   PRN Meds:.HYDROcodone-acetaminophen, ondansetron **OR** ondansetron (ZOFRAN) IV Assessment/Plan:  Corey Glenn is a 58 year old male without past medical history hospitalized for DVT of L leg found to  have Fe deficiency anemia, thrombocytopenia, and severe malnutrition.  DVT of L leg: Dopplers done outside showed DVT extending from saphenofemoral to peroneal veins. Given no prior history of it, malignancy is suspect given the other blood count abnormalities. -Follow-up SPEP/UPEP -Continue IV heparin -Defer bridging to oral anticoagulant until after GI procedure  Fe deficiency anemia: Hb 6.8 today after pRBC x 3 which signifies appropriate increase and no active bleeding. GI consulted for endoscopy/colonoscopy to r/o any malignancy which is suspect given his acute DVT, weight loss, and malnutrition. -Give pRBC x 1 & re-check  CBC -Follow-up anemia panel, UPEP/SPEP -Change diet to Clears and NPO after midnight  Thrombocytopenia: Platelets trending down from 91. Haptoglobin elevated suggestive of a hemolytic process though retic count suggestive of inadequate marrow response. -Continue assessing  Severe malnutrition: Assessed by Nutrition today.  -Continue Resource Breeze TID  #FEN:  -Diet: Clears, NPO after midnight  #DVT prophylaxis: heparin IV  #CODE STATUS: FULL CODE   Dispo: Disposition is deferred at this time, awaiting improvement of current medical problems.   The patient does not have a current PCP (No primary care provider on file.) and [facility].  The patient does have transportation limitations that hinder transportation to clinic appointments.  .Services Needed at time of discharge: Y = Yes, Blank = No PT:   OT:   RN:   Equipment:   Other:     LOS: 1 day   Corey Ilesushil Mozel Burdett, MD 06/30/2014, 12:50 PM

## 2014-06-30 NOTE — Progress Notes (Signed)
ANTICOAGULATION CONSULT NOTE   Pharmacy Consult for heparin Indication: DVT  Allergies  Allergen Reactions  . Aspirin Nausea And Vomiting    Patient Measurements: Height: 5\' 11"  (180.3 cm) Weight: 129 lb 10.1 oz (58.8 kg) IBW/kg (Calculated) : 75.3  Vital Signs: Temp: 98.6 F (37 C) (11/24 1325) Temp Source: Oral (11/24 1325) BP: 120/61 mmHg (11/24 1325) Pulse Rate: 88 (11/24 1325)  Labs:  Recent Labs  01/09/2014 0930 01/09/2014 1100 01/09/2014 2322 06/30/14 0705 06/30/14 1334 06/30/14 1344  HGB 4.8*  --  5.6* 6.8* 7.6*  --   HCT 14.0*  --  17.0* 20.7* 23.1*  --   PLT  --   --  84* 78* 91*  --   LABPROT  --  17.6*  --   --   --   --   INR  --  1.43  --   --   --   --   HEPARINUNFRC  --   --   --   --   --  <0.10*  CREATININE 1.70*  --   --   --   --   --     Estimated Creatinine Clearance: 38.4 mL/min (by C-G formula based on Cr of 1.7).   Medical History: Past Medical History  Diagnosis Date  . Chronic foot pain   . Chronic ankle pain     left  . Posterior tibial tendon dysfunction   . Chronic leg pain     left    Medications:  Prescriptions prior to admission  Medication Sig Dispense Refill Last Dose  . Acetaminophen (TYLENOL PO) Take 1-2 tablets by mouth daily as needed (for pain).   unknown  . HYDROcodone-acetaminophen (NORCO) 10-325 MG per tablet Take 1 tablet by mouth every 6 (six) hours as needed for moderate pain.   27-Jul-2014 at Unknown time   Scheduled:  . sodium chloride   Intravenous Once  . sodium chloride   Intravenous Once  . sodium chloride   Intravenous Once  . sodium chloride  3 mL Intravenous Q12H    Assessment: 58yo male c/o LLE pain x6568mo, acute occlusive extensive DVT confirmed but found to be profoundly anemic w/ Hgb down to 3.9, admitting team d/w hem who recommends anticoag if Hgb goes up w/ transfusions, Hgb 7.6, Initial HL <0.1 units/ml  No issues with infusion per RN  Goal of Therapy:  Heparin level 0.3-0.5 units/ml Monitor  platelets by anticoagulation protocol: Yes   Plan:  Increase heparin gtt to 850units/hr Check heparin level in 8 hours  Thanks for allowing pharmacy to be a part of this patient's care.  Talbert CageLora Kory Rains, PharmD Clinical Pharmacist, [phone] 06/30/2014,3:03 PM

## 2014-06-30 NOTE — Progress Notes (Addendum)
Pt seen and examined with Dr. Allena KatzPatel . Case d/w residents on rounds in detail.  Pt feels well. No fevers, no cp. States LE pain is improving.  Physical exam: Gen: AAO*3, NAD CVS: RRR, normal heart sounds Pulm: CTA b/l Abd: soft, non tender, BS + Ext: mild L LE edema - slightly worse today, non tender, no increased warmth  Assessment and Plan: 58 y/o male with severe anemia, AKI and occlusive LLE DVT  LLE DVT: - Pt with occlusive DVT L saphenofemoral junction, common femoral, femoral, popliteal, posterior tibial and peroneal veins - No u/s done as outpatient. Patient did have an x ray done with possible OA in L ankle - Will c/w heparin gtt for now and monitor CBC closely  Severe Anemia: - FOBT negative and patient with normocytic anemia - Will check anemia panel and ferritin - Pt with mildly elevated retic count despite severe anemia- likely decreased production of RBCs. Possibly bone marrow suppression v/s inutritional deficiencies (iron, B12, folic acid) - Hg improving appropriately with transfusions. No evidence of active bleed - HIV and hepatitis panel are negative - case d/w hematology. Will recall if evidence of malignancy or if unclear etiology despite work up  AKI v/s CKD: - Uncertain etiology but given hypercalcemia, renal failure and anemia would check SPEP/UPEP to rule out myeloma - c/w IVF, PRBC - check BMP today - would check u/a, urine lytes and calculate FeNa - If w/u unrevealing would consider renal sono for further w/u  Case d/w pt in detail

## 2014-06-30 NOTE — Plan of Care (Signed)
Problem: Phase I Progression Outcomes Goal: Pain controlled with appropriate interventions Outcome: Progressing  Problem: Phase II Progression Outcomes Goal: Tolerating diet Outcome: Progressing

## 2014-06-30 NOTE — Progress Notes (Signed)
ANTICOAGULATION CONSULT NOTE - Initial Consult  Pharmacy Consult for heparin Indication: DVT  Allergies  Allergen Reactions  . Aspirin Nausea And Vomiting    Patient Measurements: Height: 5\' 11"  (180.3 cm) Weight: 129 lb 10.1 oz (58.8 kg) IBW/kg (Calculated) : 75.3  Vital Signs: Temp: 98 F (36.7 C) (11/24 0400) Temp Source: Oral (11/24 0400) BP: 120/73 mmHg (11/24 0215) Pulse Rate: 79 (11/24 0155)  Labs:  Recent Labs  06/26/2014 0921 06/11/2014 0930 06/28/2014 1100 06/23/2014 2322  HGB 3.9* 4.8*  --  5.6*  HCT 12.8* 14.0*  --  17.0*  PLT 91*  --   --  84*  LABPROT  --   --  17.6*  --   INR  --   --  1.43  --   CREATININE  --  1.70*  --   --     Estimated Creatinine Clearance: 38.4 mL/min (by C-G formula based on Cr of 1.7).   Medical History: Past Medical History  Diagnosis Date  . Chronic foot pain   . Chronic ankle pain     left  . Posterior tibial tendon dysfunction   . Chronic leg pain     left    Medications:  Prescriptions prior to admission  Medication Sig Dispense Refill Last Dose  . Acetaminophen (TYLENOL PO) Take 1-2 tablets by mouth daily as needed (for pain).   unknown  . HYDROcodone-acetaminophen (NORCO) 10-325 MG per tablet Take 1 tablet by mouth every 6 (six) hours as needed for moderate pain.   06/20/2014 at Unknown time   Scheduled:  . sodium chloride   Intravenous Once  . sodium chloride   Intravenous Once  . Influenza vac split quadrivalent PF  0.5 mL Intramuscular Tomorrow-1000  . sodium chloride  3 mL Intravenous Q12H    Assessment: 58yo male c/o LLE pain x3973mo, acute occlusive extensive DVT confirmed but found to be profoundly anemic w/ Hgb down to 3.9, admitting team d/w hem who recommends anticoag if Hgb goes up w/ transfusions, Hgb now up to 5.6 w/ another transfusion after that result, now to begin conservative tx w/ heparin (preferred over LMWH to avoid spikes in anticoag activity).  Goal of Therapy:  Heparin level 0.3-0.5  units/ml Monitor platelets by anticoagulation protocol: Yes   Plan:  Will begin heparin gtt without bolus at 700 units/hr and monitor heparin levels and CBC and f/u long-term anticoag plans.  Vernard GamblesVeronda Zierra Laroque, PharmD, BCPS  06/30/2014,5:17 AM

## 2014-07-01 ENCOUNTER — Encounter (HOSPITAL_COMMUNITY): Payer: Self-pay | Admitting: Anesthesiology

## 2014-07-01 ENCOUNTER — Encounter (HOSPITAL_COMMUNITY): Payer: Self-pay

## 2014-07-01 ENCOUNTER — Encounter (HOSPITAL_COMMUNITY): Admission: EM | Disposition: E | Payer: Self-pay | Source: Home / Self Care | Attending: Pulmonary Disease

## 2014-07-01 DIAGNOSIS — N179 Acute kidney failure, unspecified: Secondary | ICD-10-CM

## 2014-07-01 DIAGNOSIS — N189 Chronic kidney disease, unspecified: Secondary | ICD-10-CM

## 2014-07-01 LAB — FOLATE
Folate: 8.6 ng/mL
Folate: 7.1 ng/mL

## 2014-07-01 LAB — TYPE AND SCREEN
ABO/RH(D): O NEG
Antibody Screen: NEGATIVE
UNIT DIVISION: 0
UNIT DIVISION: 0
Unit division: 0
Unit division: 0

## 2014-07-01 LAB — UIFE/LIGHT CHAINS/TP QN, 24-HR UR
ALPHA 1 UR: DETECTED — AB
ALPHA 2 UR: DETECTED — AB
Albumin, U: DETECTED
Beta, Urine: DETECTED — AB
Gamma Globulin, Urine: DETECTED — AB
Total Protein, Urine: 33 mg/dL — ABNORMAL HIGH (ref 5–25)

## 2014-07-01 LAB — BASIC METABOLIC PANEL
ANION GAP: 12 (ref 5–15)
BUN: 24 mg/dL — ABNORMAL HIGH (ref 6–23)
CHLORIDE: 102 meq/L (ref 96–112)
CO2: 25 meq/L (ref 19–32)
Calcium: 10.9 mg/dL — ABNORMAL HIGH (ref 8.4–10.5)
Creatinine, Ser: 1.34 mg/dL (ref 0.50–1.35)
GFR calc non Af Amer: 56 mL/min — ABNORMAL LOW (ref >90)
GFR, EST AFRICAN AMERICAN: 65 mL/min — AB (ref >90)
Glucose, Bld: 84 mg/dL (ref 70–99)
POTASSIUM: 3.6 meq/L — AB (ref 3.7–5.3)
SODIUM: 139 meq/L (ref 137–147)

## 2014-07-01 LAB — RETICULOCYTES
RBC.: 2.91 MIL/uL — ABNORMAL LOW (ref 4.22–5.81)
RETIC CT PCT: 1.8 % (ref 0.4–3.1)
Retic Count, Absolute: 52.4 10*3/uL (ref 19.0–186.0)

## 2014-07-01 LAB — IRON AND TIBC: Iron: 109 ug/dL (ref 42–135)

## 2014-07-01 LAB — HEPARIN LEVEL (UNFRACTIONATED)
HEPARIN UNFRACTIONATED: 0.16 [IU]/mL — AB (ref 0.30–0.70)
HEPARIN UNFRACTIONATED: 0.13 [IU]/mL — AB (ref 0.30–0.70)

## 2014-07-01 LAB — NA AND K (SODIUM & POTASSIUM), RAND UR
POTASSIUM UR: 39 meq/L
Sodium, Ur: 32 mEq/L

## 2014-07-01 LAB — CBC
HCT: 24.6 % — ABNORMAL LOW (ref 39.0–52.0)
HEMOGLOBIN: 8.1 g/dL — AB (ref 13.0–17.0)
MCH: 28.5 pg (ref 26.0–34.0)
MCHC: 32.9 g/dL (ref 30.0–36.0)
MCV: 86.6 fL (ref 78.0–100.0)
PLATELETS: 84 10*3/uL — AB (ref 150–400)
RBC: 2.84 MIL/uL — AB (ref 4.22–5.81)
RDW: 17.3 % — ABNORMAL HIGH (ref 11.5–15.5)
WBC: 6.3 10*3/uL (ref 4.0–10.5)

## 2014-07-01 LAB — FERRITIN
FERRITIN: 1234 ng/mL — AB (ref 22–322)
Ferritin: 930 ng/mL — ABNORMAL HIGH (ref 22–322)

## 2014-07-01 LAB — HEPATIC FUNCTION PANEL
ALT: <5 U/L (ref 0–53)
AST: 5 U/L (ref 0–37)
Albumin: 2.2 g/dL — ABNORMAL LOW (ref 3.5–5.2)
Alkaline Phosphatase: 89 U/L (ref 39–117)
BILIRUBIN DIRECT: 0.3 mg/dL (ref 0.0–0.3)
BILIRUBIN INDIRECT: 0.8 mg/dL (ref 0.3–0.9)
BILIRUBIN TOTAL: 1.1 mg/dL (ref 0.3–1.2)
Total Protein: 5.9 g/dL — ABNORMAL LOW (ref 6.0–8.3)

## 2014-07-01 LAB — TSH: TSH: 1.62 u[IU]/mL (ref 0.350–4.500)

## 2014-07-01 LAB — VITAMIN B12
VITAMIN B 12: 360 pg/mL (ref 211–911)
VITAMIN B 12: 325 pg/mL (ref 211–911)

## 2014-07-01 SURGERY — CANCELLED PROCEDURE

## 2014-07-01 MED ORDER — BISACODYL 5 MG PO TBEC
5.0000 mg | DELAYED_RELEASE_TABLET | Freq: Three times a day (TID) | ORAL | Status: AC
  Administered 2014-07-01 – 2014-07-02 (×2): 5 mg via ORAL
  Filled 2014-07-01 (×6): qty 1

## 2014-07-01 MED ORDER — PEG-KCL-NACL-NASULF-NA ASC-C 100 G PO SOLR: 0.5000 | Freq: Once | ORAL | Status: DC

## 2014-07-01 MED ORDER — PEG-KCL-NACL-NASULF-NA ASC-C 100 G PO SOLR
0.5000 | Freq: Once | ORAL | Status: DC
  Filled 2014-07-01 (×2): qty 1

## 2014-07-01 NOTE — Progress Notes (Signed)
ANTICOAGULATION CONSULT NOTE - Follow Up Consult  Pharmacy Consult for Heparin  Indication: LLE DVT  Allergies  Allergen Reactions  . Aspirin Nausea And Vomiting    Patient Measurements: Height: 5\' 11"  (180.3 cm) Weight: 129 lb 10.1 oz (58.8 kg) IBW/kg (Calculated) : 75.3  Vital Signs: Temp: 98.4 F (36.9 C) (11/25 1536) Temp Source: Oral (11/25 1536) BP: 122/74 mmHg (11/25 1536) Pulse Rate: 79 (11/25 1536)  Labs:  Recent Labs  09/18/13 0930 09/18/13 1100  06/30/14 1334  06/30/14 1930 06/30/14 2306 06/18/2014 0346 06/23/2014 0741 06/30/2014 1648  HGB 4.8*  --   < > 7.6*  --  7.6*  --  8.1*  --   --   HCT 14.0*  --   < > 23.1*  --  22.6*  --  24.6*  --   --   PLT  --   --   < > 91*  --  86*  --  84*  --   --   LABPROT  --  17.6*  --   --   --   --   --   --   --   --   INR  --  1.43  --   --   --   --   --   --   --   --   HEPARINUNFRC  --   --   --   --   < >  --  <0.10*  --  0.16* 0.13*  CREATININE 1.70*  --   --   --   --   --   --   --  1.34  --   < > = values in this interval not displayed.  Estimated Creatinine Clearance: 48.8 mL/min (by C-G formula based on Cr of 1.34).   Assessment: 58 yo M continues on heparin for acute LLE DVT.  Heparin level remains low despite multiple increases.Probably related to extensive clot.Hgb this AM 8.1 after multiple transfusions.No bleeding noted.  Noted plan for EGD/colonoscopy moved to Friday.  Heparin stop time of 0630 11/27.  Goal of Therapy:  Heparin level 0.3-0.5 units/ml, for now with low Hgb Monitor platelets by anticoagulation protocol: Yes   Plan:  Increase heparin to 1500 units/hr Check heparin level in 6 hours Heparin level and CBC daily while on heparin  Toys 'R' UsKimberly Nayan Proch, Pharm.D., BCPS Clinical Pharmacist Pager [phone] 06/17/2014 6:06 PM

## 2014-07-01 NOTE — Progress Notes (Signed)
Utilization Review Completed.  

## 2014-07-01 NOTE — Progress Notes (Signed)
ANTICOAGULATION CONSULT NOTE - Follow Up Consult  Pharmacy Consult for Heparin  Indication: DVT  Allergies  Allergen Reactions  . Aspirin Nausea And Vomiting    Patient Measurements: Height: 5\' 11"  (180.3 cm) Weight: 129 lb 10.1 oz (58.8 kg) IBW/kg (Calculated) : 75.3  Vital Signs: Temp: 98.2 F (36.8 C) (11/25 0745) Temp Source: Oral (11/25 0745) BP: 124/76 mmHg (11/25 0745) Pulse Rate: 76 (11/25 0745)  Labs:  Recent Labs  Jul 08, 2014 0930 Jul 08, 2014 1100  06/30/14 1334 06/30/14 1344 06/30/14 1930 06/30/14 2306 06/16/2014 0346 06/30/2014 0741  HGB 4.8*  --   < > 7.6*  --  7.6*  --  8.1*  --   HCT 14.0*  --   < > 23.1*  --  22.6*  --  24.6*  --   PLT  --   --   < > 91*  --  86*  --  84*  --   LABPROT  --  17.6*  --   --   --   --   --   --   --   INR  --  1.43  --   --   --   --   --   --   --   HEPARINUNFRC  --   --   --   --  <0.10*  --  <0.10*  --  0.16*  CREATININE 1.70*  --   --   --   --   --   --   --  1.34  < > = values in this interval not displayed.  Estimated Creatinine Clearance: 48.8 mL/min (by C-G formula based on Cr of 1.34).   Assessment: Heparin for acute LLE DVT. Pt noted to have low Hgb, though and improving this morning. HL still below goal at 0.16. No issues per RN.   Goal of Therapy:  Heparin level 0.3-0.5 units/ml, for now with low Hgb Monitor platelets by anticoagulation protocol: Yes   Plan:  -Increase heparin drip to 1250 units/hr -1600 HL -Daily CBC/HL -Monitor for bleeding  Sheppard CoilFrank Michel Hendon PharmD., BCPS Clinical Pharmacist Pager [phone] 07/04/2014 9:47 AM

## 2014-07-01 NOTE — Progress Notes (Signed)
Patient did not complete the movie prep. His heparin was not discontinued prior to his scheduled procedures. His EGD and colonoscope were canceled and have been rescheduled for 12:30 PM on Friday, November 27.  Jennye MoccasinSarah Gribbin

## 2014-07-01 NOTE — Anesthesia Preprocedure Evaluation (Deleted)
Anesthesia Evaluation    Reviewed: Allergy & Precautions, H&P , NPO status   History of Anesthesia Complications Negative for: history of anesthetic complications  Airway        Dental   Pulmonary Current Smoker,          Cardiovascular + Peripheral Vascular Disease     Neuro/Psych negative neurological ROS  negative psych ROS   GI/Hepatic Neg liver ROS,   Endo/Other    Renal/GU Renal InsufficiencyRenal disease     Musculoskeletal   Abdominal   Peds  Hematology   Anesthesia Other Findings   Reproductive/Obstetrics                             Anesthesia Physical Anesthesia Plan  ASA: III  Anesthesia Plan: MAC   Post-op Pain Management:    Induction: Intravenous  Airway Management Planned: Simple Face Mask  Additional Equipment:   Intra-op Plan:   Post-operative Plan:   Informed Consent:   Plan Discussed with: CRNA, Anesthesiologist and Surgeon  Anesthesia Plan Comments:         Anesthesia Quick Evaluation

## 2014-07-01 NOTE — Progress Notes (Signed)
Subjective: Overnight, he refused his Moviprep per RN charting though he reported he had drank 1.5 cans of it and that stools were no longer brown. He complained of intermittent pain in his L leg which we explained was the result of his DVT and that it would improve over time.   Objective: Vital signs in last 24 hours: Filed Vitals:   06/30/14 1800 06/30/14 2000 06/23/2014 0000 06/08/2014 0400  BP: 120/77 126/76 125/74 147/81  Pulse: 78 75 71 76  Temp: 98.6 F (37 C) 98.2 F (36.8 C) 98.5 F (36.9 C) 98.3 F (36.8 C)  TempSrc: Oral Oral Oral Oral  Resp: 22 14 20 18   Height:      Weight:      SpO2: 100% 100% 100% 100%   Weight change:   Intake/Output Summary (Last 24 hours) at 06/10/2014 0703 Last data filed at 06/15/2014 [id number]  Gross per 24 hour  Intake 925.01 ml  Output    750 ml  Net 175.01 ml   General: resting in bed, NAD HEENT: PERRL, EOMI, no scleral icterus, oropharynx clear Cardiac: RRR, no rubs, murmurs or gallops Pulm: clear to auscultation bilaterally, no wheezes, rales, or rhonchi Abd: soft, nontender, nondistended, BS present Ext: L > R leg though without signs of inflammation  Neuro: responds to questions appropriately; moving all extremities freely   Lab Results: Basic Metabolic Panel:  Recent Labs Lab 06/07/2014 0930  NA 139  K 3.3*  CL 103  GLUCOSE 106*  BUN 24*  CREATININE 1.70*   Liver Function Tests:  Recent Labs Lab 06/18/2014 0346  AST 5  ALT <5  ALKPHOS 89  BILITOT 1.1  PROT 5.9*  ALBUMIN 2.2*   CBC:  Recent Labs Lab 06/11/2014 2322 06/30/14 0705  06/30/14 1930 06/30/2014 0346  WBC 5.4 5.7  < > 5.8 6.3  NEUTROABS 3.7 3.8  --   --   --   HGB 5.6* 6.8*  < > 7.6* 8.1*  HCT 17.0* 20.7*  < > 22.6* 24.6*  MCV 88.1 87.7  < > 86.3 86.6  PLT 84* 78*  < > 86* 84*  < > = values in this interval not displayed. Coagulation:  Recent Labs Lab 06/30/2014 1100  LABPROT 17.6*  INR 1.43   Anemia Panel:  Recent Labs Lab 06/08/2014 1100    RETICCTPCT 3.3*   Urine Drug Screen: Drugs of Abuse     Component Value Date/Time   LABOPIA POSITIVE* 06/15/2014 1355   COCAINSCRNUR NONE DETECTED 06/28/2014 1355   LABBENZ NONE DETECTED 06/10/2014 1355   AMPHETMU NONE DETECTED 06/22/2014 1355   THCU NONE DETECTED 06/07/2014 1355   LABBARB NONE DETECTED 07/02/2014 1355    Urinalysis:  Recent Labs Lab 06/28/2014 1355  COLORURINE YELLOW  LABSPEC 1.015  PHURINE 5.5  GLUCOSEU NEGATIVE  HGBUR LARGE*  BILIRUBINUR NEGATIVE  KETONESUR NEGATIVE  PROTEINUR NEGATIVE  UROBILINOGEN 1.0  NITRITE NEGATIVE  LEUKOCYTESUR TRACE*     Micro Results: Recent Results (from the past 240 hour(s))  MRSA PCR Screening     Status: None   Collection Time: 06/12/2014  3:01 PM  Result Value Ref Range Status   MRSA by PCR NEGATIVE NEGATIVE Final    Comment:        The GeneXpert MRSA Assay (FDA approved for NASAL specimens only), is one component of a comprehensive MRSA colonization surveillance program. It is not intended to diagnose MRSA infection nor to guide or monitor treatment for MRSA infections.    Studies/Results: No  results found. Medications: I have reviewed the patient's current medications. Scheduled Meds: . sodium chloride   Intravenous Once  . feeding supplement (RESOURCE BREEZE)  1 Container Oral TID BM  . sodium chloride  3 mL Intravenous Q12H   Continuous Infusions: . heparin 1,050 Units/hr (06/30/2014 0441)   PRN Meds:.HYDROcodone-acetaminophen, ondansetron **OR** ondansetron (ZOFRAN) IV Assessment/Plan:  Mr. Corey Glenn is a 58 year old male without past medical history hospitalized for DVT of L leg found to have anemia, thrombocytopenia, and severe malnutrition.  DVT of L leg: Dopplers done outside showed DVT extending from saphenofemoral to peroneal veins. No signs of PE or worsening at the local site. -Continue IV heparin (Day 3)  Anemia & thrombocytopenia: Fe panel today inconsistent with Fe deficiency anemia (Fe  normal, ferritin high, UIBC low). Hb 8.1 after pRBC x 4 suggestive of no active bleeding. In the setting of his thrombocytopenia, cachexia, and new DVT, malignancy is suspect, possibly bone marrow. UPEP unremarkable for findings consistent with MM.  -Following SPEP -Repeat CBC -Colonoscopy & upper endoscopy postponed until Friday  Severe malnutrition: Continue Resource Breeze TID  #FEN:  -Diet: Heart Healthy  #DVT prophylaxis: heparin IV  #CODE STATUS: FULL CODE   Dispo: Disposition is deferred at this time, awaiting improvement of current medical problems.   The patient does not have a current PCP (No primary care provider on file.) and [facility] after discharge.  The patient does have transportation limitations that hinder transportation to clinic appointments.  .Services Needed at time of discharge: Y = Yes, Blank = No PT:   OT:   RN:   Equipment:   Other:     LOS: 2 days   Heywood Ilesushil Villa Burgin, MD 06/21/2014, 7:03 AM

## 2014-07-01 NOTE — Progress Notes (Signed)
Paged MD with not response. Pt is not drinking his Moviprep. I reeducated him about the importance but still refuses to drink the prep. Will continue to monitor and reinforce teaching. Will report to AM RN.    Alba DestineMisty L Ennis, RN, BSN

## 2014-07-01 NOTE — Progress Notes (Signed)
[facility] Transported via bed to 2 west 36. Belongings packed and are at bedside.

## 2014-07-01 NOTE — Progress Notes (Signed)
ANTICOAGULATION CONSULT NOTE - Follow Up Consult  Pharmacy Consult for Heparin  Indication: DVT  Allergies  Allergen Reactions  . Aspirin Nausea And Vomiting    Patient Measurements: Height: 5\' 11"  (180.3 cm) Weight: 129 lb 10.1 oz (58.8 kg) IBW/kg (Calculated) : 75.3  Vital Signs: Temp: 98.5 F (36.9 C) (11/25 0000) Temp Source: Oral (11/25 0000) BP: 126/76 mmHg (11/24 2000) Pulse Rate: 75 (11/24 2000)  Labs:  Recent Labs  06/13/2014 0930 06/20/2014 1100  06/30/14 0705 06/30/14 1334 06/30/14 1344 06/30/14 1930 06/30/14 2306  HGB 4.8*  --   < > 6.8* 7.6*  --  7.6*  --   HCT 14.0*  --   < > 20.7* 23.1*  --  22.6*  --   PLT  --   --   < > 78* 91*  --  86*  --   LABPROT  --  17.6*  --   --   --   --   --   --   INR  --  1.43  --   --   --   --   --   --   HEPARINUNFRC  --   --   --   --   --  <0.10*  --  <0.10*  CREATININE 1.70*  --   --   --   --   --   --   --   < > = values in this interval not displayed.  Estimated Creatinine Clearance: 38.4 mL/min (by C-G formula based on Cr of 1.7).   Assessment: Heparin for acute LLE DVT. Pt noted to have low Hgb, though it is stable x 2.  HL is <0.1. No issues per RN.   Goal of Therapy:  Heparin level 0.3-0.5 units/ml, for now with low Hgb Monitor platelets by anticoagulation protocol: Yes   Plan:  -Increase heparin drip to 1050 units/hr -0800 HL -Daily CBC/HL -Monitor for bleeding  Abran DukeLedford, Kaleen Rochette 06/26/2014,12:22 AM

## 2014-07-01 NOTE — Progress Notes (Signed)
Many attempts have been made to persuade pt to drink the Moviprep. Multiple nurses come into the room, and explain that his length of stay will be extended if he doen't drink the prep.   Pt was able to drink half of the ordered dose and had one bowel movement over the night.  Called and reported to [facility]. They may not do the EGD today as scheduled.    Information has been passed on to AM RN. Will continue to monitor.   Alba DestineMisty L Ennis, RN, BSN

## 2014-07-01 NOTE — Plan of Care (Signed)
Problem: Phase I Progression Outcomes Goal: Pain controlled with appropriate interventions Outcome: Progressing  Problem: Phase II Progression Outcomes Goal: No active bleeding Outcome: Completed/Met Date Met:  06/23/2014

## 2014-07-02 ENCOUNTER — Inpatient Hospital Stay (HOSPITAL_COMMUNITY): Payer: Medicaid Other

## 2014-07-02 ENCOUNTER — Encounter (HOSPITAL_COMMUNITY): Payer: Self-pay | Admitting: *Deleted

## 2014-07-02 DIAGNOSIS — J9600 Acute respiratory failure, unspecified whether with hypoxia or hypercapnia: Secondary | ICD-10-CM

## 2014-07-02 DIAGNOSIS — I619 Nontraumatic intracerebral hemorrhage, unspecified: Secondary | ICD-10-CM

## 2014-07-02 DIAGNOSIS — J9690 Respiratory failure, unspecified, unspecified whether with hypoxia or hypercapnia: Secondary | ICD-10-CM | POA: Insufficient documentation

## 2014-07-02 DIAGNOSIS — N182 Chronic kidney disease, stage 2 (mild): Secondary | ICD-10-CM

## 2014-07-02 DIAGNOSIS — R4020 Unspecified coma: Secondary | ICD-10-CM

## 2014-07-02 DIAGNOSIS — Z789 Other specified health status: Secondary | ICD-10-CM

## 2014-07-02 DIAGNOSIS — E876 Hypokalemia: Secondary | ICD-10-CM

## 2014-07-02 DIAGNOSIS — Z978 Presence of other specified devices: Secondary | ICD-10-CM | POA: Insufficient documentation

## 2014-07-02 LAB — BLOOD GAS, ARTERIAL
ACID-BASE EXCESS: 1.6 mmol/L (ref 0.0–2.0)
Bicarbonate: 25.5 mEq/L — ABNORMAL HIGH (ref 20.0–24.0)
DRAWN BY: 406621
O2 CONTENT: 2 L/min
O2 Saturation: 97.2 %
PO2 ART: 92.2 mmHg (ref 80.0–100.0)
Patient temperature: 98.6
TCO2: 26.6 mmol/L (ref 0–100)
pCO2 arterial: 38.3 mmHg (ref 35.0–45.0)
pH, Arterial: 7.437 (ref 7.350–7.450)

## 2014-07-02 LAB — CBC
HCT: 24.3 % — ABNORMAL LOW (ref 39.0–52.0)
HEMATOCRIT: 24.5 % — AB (ref 39.0–52.0)
HEMOGLOBIN: 7.8 g/dL — AB (ref 13.0–17.0)
Hemoglobin: 7.8 g/dL — ABNORMAL LOW (ref 13.0–17.0)
MCH: 27.8 pg (ref 26.0–34.0)
MCH: 27.7 pg (ref 26.0–34.0)
MCHC: 31.8 g/dL (ref 30.0–36.0)
MCHC: 32.1 g/dL (ref 30.0–36.0)
MCV: 87.2 fL (ref 78.0–100.0)
MCV: 86.2 fL (ref 78.0–100.0)
Platelets: 87 10*3/uL — ABNORMAL LOW (ref 150–400)
Platelets: 94 10*3/uL — ABNORMAL LOW (ref 150–400)
RBC: 2.81 MIL/uL — ABNORMAL LOW (ref 4.22–5.81)
RBC: 2.82 MIL/uL — AB (ref 4.22–5.81)
RDW: 16.8 % — ABNORMAL HIGH (ref 11.5–15.5)
RDW: 16.6 % — ABNORMAL HIGH (ref 11.5–15.5)
WBC: 5.9 10*3/uL (ref 4.0–10.5)
WBC: 5.8 10*3/uL (ref 4.0–10.5)

## 2014-07-02 LAB — URINALYSIS, ROUTINE W REFLEX MICROSCOPIC
Bilirubin Urine: NEGATIVE
Glucose, UA: NEGATIVE mg/dL
Ketones, ur: NEGATIVE mg/dL
NITRITE: NEGATIVE
PH: 5.5 (ref 5.0–8.0)
Protein, ur: NEGATIVE mg/dL
SPECIFIC GRAVITY, URINE: 1.019 (ref 1.005–1.030)
Urobilinogen, UA: 1 mg/dL (ref 0.0–1.0)

## 2014-07-02 LAB — BASIC METABOLIC PANEL
ANION GAP: 14 (ref 5–15)
BUN: 21 mg/dL (ref 6–23)
CHLORIDE: 101 meq/L (ref 96–112)
CO2: 23 meq/L (ref 19–32)
Calcium: 10.8 mg/dL — ABNORMAL HIGH (ref 8.4–10.5)
Creatinine, Ser: 1.31 mg/dL (ref 0.50–1.35)
GFR calc Af Amer: 67 mL/min — ABNORMAL LOW (ref >90)
GFR calc non Af Amer: 58 mL/min — ABNORMAL LOW (ref >90)
Glucose, Bld: 83 mg/dL (ref 70–99)
POTASSIUM: 3.5 meq/L — AB (ref 3.7–5.3)
SODIUM: 138 meq/L (ref 137–147)

## 2014-07-02 LAB — URINE MICROSCOPIC-ADD ON

## 2014-07-02 LAB — MAGNESIUM: Magnesium: 1.3 mg/dL — ABNORMAL LOW (ref 1.5–2.5)

## 2014-07-02 LAB — PHOSPHORUS: Phosphorus: 2.8 mg/dL (ref 2.3–4.6)

## 2014-07-02 LAB — HEPARIN LEVEL (UNFRACTIONATED)
Heparin Unfractionated: 0.33 IU/mL (ref 0.30–0.70)
Heparin Unfractionated: 0.39 IU/mL (ref 0.30–0.70)

## 2014-07-02 LAB — IRON AND TIBC
Iron: 62 ug/dL (ref 42–135)
Saturation Ratios: 53 % (ref 20–55)
TIBC: 117 ug/dL — AB (ref 215–435)
UIBC: 55 ug/dL — ABNORMAL LOW (ref 125–400)

## 2014-07-02 LAB — GLUCOSE, CAPILLARY
GLUCOSE-CAPILLARY: 92 mg/dL (ref 70–99)
Glucose-Capillary: 65 mg/dL — ABNORMAL LOW (ref 70–99)
Glucose-Capillary: 94 mg/dL (ref 70–99)

## 2014-07-02 LAB — MRSA PCR SCREENING: MRSA by PCR: NEGATIVE

## 2014-07-02 LAB — SAVE SMEAR

## 2014-07-02 MED ORDER — NICARDIPINE HCL IN NACL 20-0.86 MG/200ML-% IV SOLN
3.0000 mg/h | INTRAVENOUS | Status: DC
Start: 2014-07-02 — End: 2014-07-04
  Administered 2014-07-02: 3 mg/h via INTRAVENOUS
  Administered 2014-07-03 (×2): 2.5 mg/h via INTRAVENOUS
  Filled 2014-07-02 (×2): qty 200

## 2014-07-02 MED ORDER — MIDAZOLAM HCL 2 MG/2ML IJ SOLN
INTRAMUSCULAR | Status: AC
  Filled 2014-07-02: qty 2

## 2014-07-02 MED ORDER — FENTANYL CITRATE 0.05 MG/ML IJ SOLN: 25.0000 ug | INTRAMUSCULAR | Status: DC | PRN

## 2014-07-02 MED ORDER — FENTANYL CITRATE 0.05 MG/ML IJ SOLN
INTRAMUSCULAR | Status: AC
Start: 2014-07-02 — End: 2014-07-02
  Administered 2014-07-02: 50 ug
  Filled 2014-07-02: qty 2

## 2014-07-02 MED ORDER — CHLORHEXIDINE GLUCONATE 0.12 % MT SOLN
OROMUCOSAL | Status: AC
  Administered 2014-07-02: 20:00:00
  Filled 2014-07-02: qty 15

## 2014-07-02 MED ORDER — "THROMBI-PAD 3""X3"" EX PADS"
1.0000 | MEDICATED_PAD | Freq: Once | CUTANEOUS | Status: AC
  Administered 2014-07-02: 1 via TOPICAL
  Filled 2014-07-02: qty 1

## 2014-07-02 MED ORDER — DEXTROSE 50 % IV SOLN
INTRAVENOUS | Status: AC
  Filled 2014-07-02: qty 50

## 2014-07-02 MED ORDER — POTASSIUM CHLORIDE IN NACL 20-0.9 MEQ/L-% IV SOLN
INTRAVENOUS | Status: DC
  Administered 2014-07-02 – 2014-07-03 (×2): via INTRAVENOUS
  Filled 2014-07-02 (×6): qty 1000

## 2014-07-02 MED ORDER — IOHEXOL 350 MG/ML SOLN: 50.0000 mL | Freq: Once | INTRAVENOUS | Status: DC | PRN

## 2014-07-02 MED ORDER — LORAZEPAM 2 MG/ML IJ SOLN
0.5000 mg | INTRAMUSCULAR | Status: DC | PRN
  Administered 2014-07-03: 1 mg via INTRAVENOUS
  Filled 2014-07-02: qty 1

## 2014-07-02 MED ORDER — CETYLPYRIDINIUM CHLORIDE 0.05 % MT LIQD
7.0000 mL | Freq: Four times a day (QID) | OROMUCOSAL | Status: DC
  Administered 2014-07-03 – 2014-07-04 (×7): 7 mL via OROMUCOSAL

## 2014-07-02 MED ORDER — POTASSIUM CHLORIDE CRYS ER 20 MEQ PO TBCR
40.0000 meq | EXTENDED_RELEASE_TABLET | Freq: Once | ORAL | Status: AC
  Administered 2014-07-02: 40 meq via ORAL
  Filled 2014-07-02: qty 2

## 2014-07-02 MED ORDER — SODIUM CHLORIDE 0.9 % IV SOLN
INTRAVENOUS | Status: DC
  Administered 2014-07-02: 12:00:00 via INTRAVENOUS

## 2014-07-02 MED ORDER — PROTAMINE SULFATE 10 MG/ML IV SOLN
50.0000 mg | Freq: Once | INTRAVENOUS | Status: AC
  Administered 2014-07-02: 50 mg via INTRAVENOUS
  Filled 2014-07-02: qty 5

## 2014-07-02 MED ORDER — PIPERACILLIN-TAZOBACTAM 3.375 G IVPB
3.3750 g | Freq: Three times a day (TID) | INTRAVENOUS | Status: DC
  Administered 2014-07-02 – 2014-07-04 (×5): 3.375 g via INTRAVENOUS
  Filled 2014-07-02 (×8): qty 50

## 2014-07-02 MED ORDER — CHLORHEXIDINE GLUCONATE 0.12 % MT SOLN
15.0000 mL | Freq: Two times a day (BID) | OROMUCOSAL | Status: DC
  Administered 2014-07-03 – 2014-07-04 (×3): 15 mL via OROMUCOSAL
  Filled 2014-07-02 (×3): qty 15

## 2014-07-02 NOTE — Progress Notes (Signed)
Patient ID: Corey Glenn, male   DOB: 03-28-1956, 58 y.o.   MRN: [id number]  Medical attending: Prompt attention by neurology, neurosurgery, and critical care medicine greatly appreciated. I was informed of the acute change in the patient's condition with sudden unresponsiveness and findings of extensive intracerebral hemorrhage on CT scan. Heparin was discontinued. Protamine given. Patient transferred to the intensive care unit and intubated to protect his airway. He is currently unresponsive. Pupils are small, round, equal, not responsive to light. Flaccid extremities. Deep tendon reflexes are intact. Left Babinski sign positive. Patient's brother [name] informed of this tragic event and discussed at length with both neurosurgeon and critical care physician. Per neurosurgery prognosis is poor and even if patient recovers there will likely be significant handicaps. Family to make decision on level of care. Ultrasound of the kidneys ureter and bladder done earlier today showed multiple stones within the bladder but in addition a soft tissue density was noted suspicious for a urothelial mass. Further evaluation will have to be postponed unless and until the patient has a significant improvement which does not seem likely at this time.

## 2014-07-02 NOTE — Progress Notes (Signed)
MD notified to consult CCm. Darrel HooverWilson,Shaunae Sieloff S 7:02 PM

## 2014-07-02 NOTE — Progress Notes (Addendum)
MD paged pt not responding, rapid response notified and at bedside. Darrel HooverWilson,Amayrani Bennick S   5:59 PM   MD to come to bedside. Darrel HooverWilson,Latunya Kissick S 6:11 PM

## 2014-07-02 NOTE — Progress Notes (Signed)
Spoke to brother Ron, consent obtained for central line placement and intubation.

## 2014-07-02 NOTE — Progress Notes (Signed)
[facility], family contacted and updated, pt last seen alert and oriented around 1500 after returning from xray, pt verbalized he was not in any pain or distress, rounded hourly and appeared to be napping. Unable to arouse when attempting to give his evening meds. Vs and cbg taken and noted. Charge RN, rapid response RN aware and at bedside with me, MD notified and also at bedside. 12lead done, abg done. Pt taken to CT STAT, and bleed noted.  Corey Glenn,Corey Glenn 7:17 PM

## 2014-07-02 NOTE — Progress Notes (Signed)
MD notified of bleed per CT, will be transferred to ICU. Darrel HooverWilson,Hussain Maimone S 6:51 PM

## 2014-07-02 NOTE — Progress Notes (Signed)
Patient ID: Corey Glenn, male   DOB: 1956/04/27, 58 y.o.   MRN: [id number] Medicine attending: I personally interviewed and examined this patient today with the medical resident team and I concur with their evaluation and management plan. We reviewed his peripheral blood from admission and a new sample done today. My initial impression from admission was that this was severe iron deficiency. However, in retrospect, given the very low hemoglobin of 4 g on admission, the findings on the initial blood film had considerable artifact. Today's blood film shows a dimorphic population of red cells macrocytic mixed with microcytic cells with many teardrop cells. Platelets are decreased. I saw one myelocyte. One basophil. No blasts. Current findings are suspicious for an underlying myeloproliferative disorder. However, in view of his cachexia, anorexia, weight loss, significant elevation of his ferritin, I remain concerned that he has an underlying malignancy. He is a smoker. He denies any acute or chronic respiratory symptoms but we will go ahead and get a chest radiograph today. Admission urinalysis showed positive hemoglobin and 25-50 red cells. GU malignancy in the differential diagnosis of his anemia. He is scheduled for upper and lower endoscopy tomorrow. If this is unrevealing, I will likely do a bone marrow aspiration and biopsy early next week.

## 2014-07-02 NOTE — Progress Notes (Signed)
MD notified for slow bleed to left arm, on heparin drip, will continue to watch and follow up in 15 min per MD request. Darrel HooverWilson,Darah Simkin S 12:15 PM

## 2014-07-02 NOTE — Progress Notes (Signed)
 Subjective: This AM, he has no complaints. We explained to him that he has signs for an inflammatory process though do not have a definite cause. He reports having several siblings all in good health; no cancer runs in his family. He acknowledged though reported he would like to go home.   Objective: Vital signs in last 24 hours: Filed Vitals:   07/05/2014 1536 06/07/2014 2042 07/02/14 0440 07/02/14 1213  BP: 122/74 112/59 114/71 122/73  Pulse: 79 75 71 71  Temp: 98.4 F (36.9 C) 98 F (36.7 C) 98.1 F (36.7 C)   TempSrc: Oral Oral Oral   Resp: 20 18 18 18  Height:      Weight:      SpO2: 100% 100% 100% 100%   Weight change:   Intake/Output Summary (Last 24 hours) at 07/02/14 1405 Last data filed at 07/02/14 1343  Gross per 24 hour  Intake    240 ml  Output   1100 ml  Net   -860 ml   General: resting in bed, NAD HEENT: PERRL, EOMI, no scleral icterus, oropharynx clear Cardiac: RRR, no rubs, murmurs or gallops Pulm: clear to auscultation bilaterally, no wheezes, rales, or rhonchi Abd: soft, nontender, nondistended, BS present Ext: L > R leg though without signs of inflammation  Neuro: responds to questions appropriately; moving all extremities freely   Lab Results: Basic Metabolic Panel:  Recent Labs Lab 07/02/2014 0741 07/02/14 0045  NA 139 138  K 3.6* 3.5*  CL 102 101  CO2 25 23  GLUCOSE 84 83  BUN 24* 21  CREATININE 1.34 1.31  CALCIUM 10.9* 10.8*  PHOS  --  2.8   Liver Function Tests:  Recent Labs Lab 07/02/2014 0346  AST 5  ALT <5  ALKPHOS 89  BILITOT 1.1  PROT 5.9*  ALBUMIN 2.2*   CBC:  Recent Labs Lab 06/28/2014 2322 06/30/14 0705  07/02/14 0045 07/02/14 0840  WBC 5.4 5.7  < > 5.9 5.8  NEUTROABS 3.7 3.8  --   --   --   HGB 5.6* 6.8*  < > 7.8* 7.8*  HCT 17.0* 20.7*  < > 24.3* 24.5*  MCV 88.1 87.7  < > 86.2 87.2  PLT 84* 78*  < > 87* 94*  < > = values in this interval not displayed. Coagulation:  Recent Labs Lab 06/20/2014 1100  LABPROT  17.6*  INR 1.43   Anemia Panel:  Recent Labs Lab 06/08/2014 0346  VITAMINB12 325  FOLATE 7.1  FERRITIN 1234*  TIBC 117*  IRON 62  RETICCTPCT 1.8   Urine Drug Screen: Drugs of Abuse     Component Value Date/Time   LABOPIA POSITIVE* 06/28/2014 1355   COCAINSCRNUR NONE DETECTED 06/14/2014 1355   LABBENZ NONE DETECTED 06/10/2014 1355   AMPHETMU NONE DETECTED 06/21/2014 1355   THCU NONE DETECTED 06/28/2014 1355   LABBARB NONE DETECTED 06/28/2014 1355    Urinalysis:  Recent Labs Lab 06/09/2014 1355 06/13/2014 2031  COLORURINE YELLOW YELLOW  LABSPEC 1.015 1.019  PHURINE 5.5 5.5  GLUCOSEU NEGATIVE NEGATIVE  HGBUR LARGE* LARGE*  BILIRUBINUR NEGATIVE NEGATIVE  KETONESUR NEGATIVE NEGATIVE  PROTEINUR NEGATIVE NEGATIVE  UROBILINOGEN 1.0 1.0  NITRITE NEGATIVE NEGATIVE  LEUKOCYTESUR TRACE* SMALL*     Micro Results: Recent Results (from the past 240 hour(s))  MRSA PCR Screening     Status: None   Collection Time: 06/25/2014  3:01 PM  Result Value Ref Range Status   MRSA by PCR NEGATIVE NEGATIVE Final      Comment:        The GeneXpert MRSA Assay (FDA approved for NASAL specimens only), is one component of a comprehensive MRSA colonization surveillance program. It is not intended to diagnose MRSA infection nor to guide or monitor treatment for MRSA infections.    Studies/Results: Dg Chest 2 View  07/02/2014   CLINICAL DATA:  DVT in the left lower extremity.  Anemia.  Smoker.  EXAM: CHEST  2 VIEW  COMPARISON:  None.  FINDINGS: The lungs are clear. Heart size is upper normal. No pneumothorax or pleural effusion. No focal bony abnormality.  IMPRESSION: No acute disease.   Electronically Signed   By: Inge Rise M.D.   On: 07/02/2014 13:52   Medications: I have reviewed the patient's current medications. Scheduled Meds: . sodium chloride   Intravenous Once  . bisacodyl  5 mg Oral TID  . feeding supplement (RESOURCE BREEZE)  1 Container Oral TID BM  . peg 3350 powder   0.5 kit Oral Once  . sodium chloride  3 mL Intravenous Q12H   Continuous Infusions: . sodium chloride 125 mL/hr at 07/02/14 1215  . heparin 1,500 Units/hr (07/02/14 0339)   PRN Meds:.HYDROcodone-acetaminophen, ondansetron **OR** ondansetron (ZOFRAN) IV Assessment/Plan:  Corey Glenn is a 58 year old male without past medical history hospitalized for DVT of L leg found to have anemia, thrombocytopenia, severe malnutrition, hypercalcemia, CKD Stage 2, and persistent mild hypokalemia.  DVT of L leg: Dopplers done in ED showed DVT extending from saphenofemoral to peroneal veins.  -Continue IV heparin (Day 4) -Defer bridging until after his GI procedures tomorrow  Anemia & thrombocytopenia: Hb 7.8 this AM, s/p pRBC x 4. Platelets <100 though stable as well. Along with cachexia and new DVT, malignancy is suspect, possibly bone marrow. Repeat UA with stable hematuria also suggestive of GU lesion given h/o smoking and EtOH use. CXR not suggestive of lung cancer either.    -Colonoscopy & upper endoscopy postponed until Friday  Hypercalcemia: Adjusted Ca 12, P 2.8. In the context of anemia and lowered renal function, multiple myeloma is suspect though UPEP unremarkable.  -Start NS @ 125cc/hr -Check urine creatinine clearance -Follow up SPEP & PTH   Mild Hypokalemia: K 3.5 this AM. Possibly 2/2 GI losses from bowel prep though K low on admission as well (3.3). -Replete with Kdur 88mq today -Check Mg  CKD Stage 2: Crt stable at 1.3, no prior baseline0.  -Continue following.  Severe malnutrition: Continue Resource Breeze TID  #FEN:  -Diet: Heart Healthy -NS @ 125cc/hr  #DVT prophylaxis: heparin IV  #CODE STATUS: FULL CODE   Dispo: Disposition is deferred at this time, awaiting improvement of current medical problems.   The patient does not have a current PCP (No primary care provider on file.) and [facility].  The patient does have  transportation limitations that hinder transportation to clinic appointments.  .Services Needed at time of discharge: Y = Yes, Blank = No PT:   OT:   RN:   Equipment:   Other:     LOS: 3 days   RCharlott Rakes MD 07/02/2014, 2:31 PM

## 2014-07-02 NOTE — Progress Notes (Signed)
ANTICOAGULATION CONSULT NOTE - Follow Up Consult  Pharmacy Consult for Heparin Indication: DVT  Allergies  Allergen Reactions  . Aspirin Nausea And Vomiting    Patient Measurements: Height: 5\' 11"  (180.3 cm) Weight: 129 lb 10.1 oz (58.8 kg) IBW/kg (Calculated) : 75.3 Heparin Dosing Weight: 58.8kg  Vital Signs: Temp: 98.1 F (36.7 C) (11/26 0440) Temp Source: Oral (11/26 0440) BP: 114/71 mmHg (11/26 0440) Pulse Rate: 71 (11/26 0440)  Labs:  Recent Labs  09/04/2013 1100  06/18/2014 0346 06/20/2014 0741 07/02/2014 1648 07/02/14 0045 07/02/14 0840 07/02/14 0850  HGB  --   < > 8.1*  --   --  7.8* 7.8*  --   HCT  --   < > 24.6*  --   --  24.3* 24.5*  --   PLT  --   < > 84*  --   --  87* 94*  --   LABPROT 17.6*  --   --   --   --   --   --   --   INR 1.43  --   --   --   --   --   --   --   HEPARINUNFRC  --   < >  --  0.16* 0.13* 0.33  --  0.39  CREATININE  --   --   --  1.34  --  1.31  --   --   < > = values in this interval not displayed.  Estimated Creatinine Clearance: 49.9 mL/min (by C-G formula based on Cr of 1.31).   Medications:  Heparin 1500 units/hr  Assessment: 58yom continues on heparin for acute LLE DVT. Patient noted to have low Hg on admit which has improved with transfusion. Heparin level (0.39) remains therapeutic (x2) on current rate - will continue and follow-up anticoagulation plans. Noted plans for EGD/colonoscopy on 11/27 - GI has requested heparin to be stopped ~0630 tomorrow AM (orders placed and communicated to RN). - H/H and Plts low but stable - No bleeding reported per RN  Goal of Therapy:  Heparin level 0.3-0.5 units/ml Monitor platelets by anticoagulation protocol: Yes   Plan:  1. Continue heparin drip 1500 units/hr (15 ml/hr) - discontinue ~ 0630 on 11/27 per GI order for EGD/colonoscopy 2. Daily heparin level and CBC 3. Monitor s/sx of bleeding  Cleon DewDulaney, [place]  [phone] 07/02/2014,10:21 AM

## 2014-07-02 NOTE — Progress Notes (Addendum)
INTERNAL MEDICINE TEACHING SERVICE Night Float Progress Note   Subjective:    We were called at 6:09 PM by the RN for evaluation of acute encephaopaty. At time of evaluation, pt was unresponsive to verbal and also sternal rub. Last found normal was around 3 pm when he was talking to the nurse about the football game. Nurse came to check on him and he was not responding. Glucose ~90. RN stated seeing patient twitch his extremities. He was incontinent of urine during this event, which is new for him.     Saw patient with Dr. Yetta BarreJones and subsequently also with neurologist Dr. Cyril Mourningamillo.  Objective:    BP 151/82 mmHg  Pulse 71  Temp(Src) 98.1 F (36.7 C) (Oral)  Resp 18  Ht 5\' 11"  (1.803 m)  Wt 129 lb 10.1 oz (58.8 kg)  BMI 18.09 kg/m2  SpO2 100%   Labs: Basic Metabolic Panel:    Component Value Date/Time   NA 138 07/02/2014 0045   K 3.5* 07/02/2014 0045   CL 101 07/02/2014 0045   CO2 23 07/02/2014 0045   BUN 21 07/02/2014 0045   CREATININE 1.31 07/02/2014 0045   GLUCOSE 83 07/02/2014 0045   CALCIUM 10.8* 07/02/2014 0045    CBC:    Component Value Date/Time   WBC 5.8 07/02/2014 0840   HGB 7.8* 07/02/2014 0840   HCT 24.5* 07/02/2014 0840   PLT 94* 07/02/2014 0840   MCV 87.2 07/02/2014 0840   NEUTROABS 3.8 06/30/2014 0705   LYMPHSABS 1.3 06/30/2014 0705   MONOABS 0.5 06/30/2014 0705   EOSABS 0.1 06/30/2014 0705   BASOSABS 0.0 06/30/2014 0705    Cardiac Enzymes: No results found for: CKTOTAL, CKMB, CKMBINDEX, TROPONINI  Physical Exam: General: Vital signs reviewed and noted. satting well. Thin man, GCS 5.   Lungs:  Normal respiratory effort. Clear to auscultation BL without crackles or wheezes.  Heart: RRR. S1 and S2 normal without gallop, murmur, or rubs.  Abdomen:  BS normoactive. Soft, Nondistended, non-tender.  No masses or organomegaly.  Extremities: No pretibial edema.    Neuro: dysconjugate gaze, left outward. Pinpoint pupils. Non verbal, doesn't open eyes,  flexes ext to pain only. Reflexes normal.  Neurologic Exam   Assessment/ Plan:    58 yo male admitted with massive DVT left leg, anemia s/p 4prbcs, and thrombocytopenia plts <100, on heparin drip, found to be acutely encephalopathic.  Acute Encephalopathy -unclear etiology. Normal glucose. no recent psychotrophic medication, vitals normal, breathing fine, electrolytes mostly normal. Consulted neurology Dr. Cyril Mourningamillo. He thinks this might be a brainstem problem. Very concerned for Cerebral hemorrhage in the setting of heparin.  GCS 5.   - Stat CT head and CT angio gram (crt below 1.5). - if CT negative then will need MRI per neuro - stat ABG - stat BMP - stopped heparin drip  - will f/up further neuro recs and will ask night float team to check on the patient again.    Addendum: 6:47 pm RN called back stating CT head showed hemorrhage and patient has order to be transferred to the ICU by the charge RN.  [name], MD  07/02/2014, 6:32 PM

## 2014-07-02 NOTE — Progress Notes (Signed)
Discussed CT findings w/ Dr. Yetta BarreJones (neurosurgery), coming to evaluate patient. Sent for stat PTT, PT/INR. Attempted to call next of kin (brother, Ron), however, was unable to get a hold of. Updated mother. RN discussed w/ family, obtained consent for intubation.   Signed: Lars MassonJones, Asaph Serena, MD 07/02/2014 7:38 PM

## 2014-07-02 NOTE — Procedures (Signed)
Oral Intubation Procedure Note   Procedure: Intubation Indications: Respiratory insufficiency due to AMS Consent: Unable to obtain consent because of altered level of consciousness. Time Out: Verified patient identification, verified procedure, site/side was marked, verified correct patient position, special equipment/implants available, medications/allergies/relevent history reviewed, required imaging and test results available.   Pre-meds: Etomidate 20 mg IV  Neuromuscular blockade: Rocuronium 50 mg IV  Laryngoscope: #4 MAC  Visualization: cords fully visualized  ETT: 7.5 ETT passed on first attempt and secured @ 24 cm at incisors  Findings: copious purulent secretions in ETT after intubation   Evaluation:  CXR pending  Pt tolerated procedure well without complications   Billy Fischeravid Reice Bienvenue, MD ; PCCM service

## 2014-07-02 NOTE — Progress Notes (Signed)
ANTICOAGULATION CONSULT NOTE - Follow Up Consult  Pharmacy Consult for heparin Indication: DVT  Labs:  Recent Labs  06/12/2014 0930 06/12/2014 1100  06/30/14 1334  06/30/14 1930  06/25/2014 0346 06/24/2014 0741 06/10/2014 1648 07/02/14 0045  HGB 4.8*  --   < > 7.6*  --  7.6*  --  8.1*  --   --   --   HCT 14.0*  --   < > 23.1*  --  22.6*  --  24.6*  --   --   --   PLT  --   --   < > 91*  --  86*  --  84*  --   --   --   LABPROT  --  17.6*  --   --   --   --   --   --   --   --   --   INR  --  1.43  --   --   --   --   --   --   --   --   --   HEPARINUNFRC  --   --   --   --   < >  --   < >  --  0.16* 0.13* 0.33  CREATININE 1.70*  --   --   --   --   --   --   --  1.34  --  1.31  < > = values in this interval not displayed.   Assessment/Plan:  58yo male therapeutic on heparin after multiple modest rate increases given profound anemia. Will continue gtt at current rate and confirm stable with additional level.   Vernard GamblesVeronda Elanna Bert, PharmD, BCPS  07/02/2014,1:34 AM

## 2014-07-02 NOTE — Progress Notes (Signed)
ANTIBIOTIC CONSULT NOTE - INITIAL  Pharmacy Consult for Zosyn  Indication: aspiration pneumonia  Allergies  Allergen Reactions  . Aspirin Nausea And Vomiting    Patient Measurements: Height: 5\' 11"  (180.3 cm) Weight: 129 lb 10.1 oz (58.8 kg) IBW/kg (Calculated) : 75.3  Vital Signs: Temp: 97.8 F (36.6 C) (11/26 1905) Temp Source: Axillary (11/26 1905) BP: 168/92 mmHg (11/26 2015) Pulse Rate: 82 (11/26 2025) Intake/Output from previous day: 11/25 0701 - 11/26 0700 In: 0  Out: 100 [Urine:100] Intake/Output from this shift:    Labs:  Recent Labs  06/10/2014 0346 07/05/2014 0741 07/02/14 0045 07/02/14 0840  WBC 6.3  --  5.9 5.8  HGB 8.1*  --  7.8* 7.8*  PLT 84*  --  87* 94*  CREATININE  --  1.34 1.31  --    Estimated Creatinine Clearance: 49.9 mL/min (by C-G formula based on Cr of 1.31). No results for input(s): VANCOTROUGH, VANCOPEAK, VANCORANDOM, GENTTROUGH, GENTPEAK, GENTRANDOM, TOBRATROUGH, TOBRAPEAK, TOBRARND, AMIKACINPEAK, AMIKACINTROU, AMIKACIN in the last 72 hours.   Microbiology: Recent Results (from the past 720 hour(s))  MRSA PCR Screening     Status: None   Collection Time: 06/24/2014  3:01 PM  Result Value Ref Range Status   MRSA by PCR NEGATIVE NEGATIVE Final    Comment:        The GeneXpert MRSA Assay (FDA approved for NASAL specimens only), is one component of a comprehensive MRSA colonization surveillance program. It is not intended to diagnose MRSA infection nor to guide or monitor treatment for MRSA infections.     Medical History: Past Medical History  Diagnosis Date  . Chronic foot pain   . Chronic ankle pain     left  . Posterior tibial tendon dysfunction   . Chronic leg pain     left    Medications:  Anti-infectives    None     Assessment: 58 year old male admitted with DVT and anemia, now found to have a large ICH.  He is to be covered for possible aspiration pneumonia with Zosyn.  His renal function is adequate for  extended infusion.  Plan:  Zosyn 3.375 gm IV q8h extended infusion Monitor renal function  Estella HuskMichelle Makeda Peeks, Pharm.D., BCPS, AAHIVP Clinical Pharmacist Phone: [phone] or [phone] 07/02/2014, 8:49 PM

## 2014-07-02 NOTE — Consult Note (Signed)
Referring Physician: Dr. Dareen Piano    Chief Complaint: altered mental status  HPI:                                                                                                                                         Corey Glenn is an 58 y.o. male with chronic foot pain, admitted with left LE DVT who has been on IV heparin and doing well until around 6 pm today when his nurse found him with altered mental status, unable to respond to verbal or painful commands. Evaluation by internal medicine resident revealed a dysconjugate gaze and neuro was asked to see patient for further evaluation. IV heparin was stopped promptly. At this moment, patient is unresponsive to painful stimuli.  Date last known well: 07/02/14 Time last known well: 3 pm tPA Given: no, IV heparin    Past Medical History  Diagnosis Date  . Chronic foot pain   . Chronic ankle pain     left  . Posterior tibial tendon dysfunction   . Chronic leg pain     left    Past Surgical History  Procedure Laterality Date  . Leg surgery Left     Open treatment internal fixation left ankle 25 years ago    History reviewed. No pertinent family history. Social History:  reports that he has been smoking Cigarettes.  He has a 12.5 pack-year smoking history. He has never used smokeless tobacco. He reports that he drinks alcohol. He reports that he does not use illicit drugs.  Allergies:  Allergies  Allergen Reactions  . Aspirin Nausea And Vomiting    Medications:                                                                                                                           Scheduled: . sodium chloride   Intravenous Once  . bisacodyl  5 mg Oral TID  . dextrose      . feeding supplement (RESOURCE BREEZE)  1 Container Oral TID BM  . peg 3350 powder  0.5 kit Oral Once  . sodium chloride  3 mL Intravenous Q12H    ROS: unobtainable from patient due to mental status  History obtained from unobtainable from patient due to mental status    Physical exam: unresponsive.Blood pressure 151/82, pulse 71, temperature 98.1 F (36.7 C), temperature source Oral, resp. rate 18, height 5' 11"  (1.803 m), weight 58.8 kg (129 lb 10.1 oz), SpO2 100 %.  Head: normocephalic. Neck: supple, no bruits, no JVD. Cardiac: no murmurs. Lungs: clear. Abdomen: soft, no tender, no mass. Extremities: swelling distal left leg with increased temperature Neurologic Examination:                                                                                                      Mental status: unresponsive to painful stimuli. CN 2-12: asymmetric pupils left smaller than right. Dysconjugate gaze. Doesn't blink to threat. Face seems to be symmetric. Tongue midline. Motor: minimal motor movement left arm upon painful stimuli. Sensory: minimal reaction to pain left arm. DTR's: 1+ all over. Plantars: downgoing. Coordination and gait: unable to test    Results for orders placed or performed during the hospital encounter of 06/14/2014 (from the past 48 hour(s))  CBC     Status: Abnormal   Collection Time: 06/30/14  7:30 PM  Result Value Ref Range   WBC 5.8 4.0 - 10.5 K/uL   RBC 2.62 (L) 4.22 - 5.81 MIL/uL   Hemoglobin 7.6 (L) 13.0 - 17.0 g/dL   HCT 22.6 (L) 39.0 - 52.0 %   MCV 86.3 78.0 - 100.0 fL   MCH 29.0 26.0 - 34.0 pg   MCHC 33.6 30.0 - 36.0 g/dL   RDW 17.4 (H) 11.5 - 15.5 %   Platelets 86 (L) 150 - 400 K/uL    Comment: CONSISTENT WITH PREVIOUS RESULT  Heparin level (unfractionated)     Status: Abnormal   Collection Time: 06/30/14 11:06 PM  Result Value Ref Range   Heparin Unfractionated <0.10 (L) 0.30 - 0.70 IU/mL    Comment:        IF HEPARIN RESULTS ARE BELOW EXPECTED VALUES, AND PATIENT DOSAGE HAS BEEN CONFIRMED, SUGGEST FOLLOW UP TESTING OF ANTITHROMBIN III LEVELS.   TSH     Status: None   Collection Time:  06/20/2014  3:46 AM  Result Value Ref Range   TSH 1.620 0.350 - 4.500 uIU/mL  Hepatic function panel     Status: Abnormal   Collection Time: 06/28/2014  3:46 AM  Result Value Ref Range   Total Protein 5.9 (L) 6.0 - 8.3 g/dL   Albumin 2.2 (L) 3.5 - 5.2 g/dL   AST 5 0 - 37 U/L   ALT <5 0 - 53 U/L   Alkaline Phosphatase 89 39 - 117 U/L   Total Bilirubin 1.1 0.3 - 1.2 mg/dL   Bilirubin, Direct 0.3 0.0 - 0.3 mg/dL   Indirect Bilirubin 0.8 0.3 - 0.9 mg/dL  CBC     Status: Abnormal   Collection Time: 06/30/2014  3:46 AM  Result Value Ref Range   WBC 6.3 4.0 - 10.5 K/uL   RBC 2.84 (L) 4.22 - 5.81 MIL/uL   Hemoglobin 8.1 (L) 13.0 - 17.0 g/dL   HCT 24.6 (L) 39.0 -  52.0 %   MCV 86.6 78.0 - 100.0 fL   MCH 28.5 26.0 - 34.0 pg   MCHC 32.9 30.0 - 36.0 g/dL   RDW 17.3 (H) 11.5 - 15.5 %   Platelets 84 (L) 150 - 400 K/uL    Comment: CONSISTENT WITH PREVIOUS RESULT  Vitamin B12     Status: None   Collection Time: 06/19/2014  3:46 AM  Result Value Ref Range   Vitamin B-12 325 211 - 911 pg/mL    Comment: Performed at [facility]  Folate     Status: None   Collection Time: 07/06/2014  3:46 AM  Result Value Ref Range   Folate 7.1 ng/mL    Comment: (NOTE) Reference Ranges        Deficient:       0.4 - 3.3 ng/mL        Indeterminate:   3.4 - 5.4 ng/mL        Normal:              > 5.4 ng/mL Performed at [facility]   Iron and TIBC     Status: Abnormal   Collection Time: 06/09/2014  3:46 AM  Result Value Ref Range   Iron 62 42 - 135 ug/dL   TIBC 117 (L) 215 - 435 ug/dL   Saturation Ratios 53 20 - 55 %   UIBC 55 (L) 125 - 400 ug/dL    Comment: Performed at [facility]  Ferritin     Status: Abnormal   Collection Time: 06/22/2014  3:46 AM  Result Value Ref Range   Ferritin 1234 (H) 22 - 322 ng/mL    Comment: Performed at [place]     Status: Abnormal   Collection Time: 06/27/2014  3:46 AM  Result Value Ref Range   Retic Ct Pct 1.8 0.4 - 3.1 %    RBC. 2.91 (L) 4.22 - 5.81 MIL/uL   Retic Count, Rannie 52.4 19.0 - 186.0 K/uL  Heparin level (unfractionated)     Status: Abnormal   Collection Time: 06/27/2014  7:41 AM  Result Value Ref Range   Heparin Unfractionated 0.16 (L) 0.30 - 0.70 IU/mL    Comment:        IF HEPARIN RESULTS ARE BELOW EXPECTED VALUES, AND PATIENT DOSAGE HAS BEEN CONFIRMED, SUGGEST FOLLOW UP TESTING OF ANTITHROMBIN III LEVELS.   Basic metabolic panel     Status: Abnormal   Collection Time: 06/28/2014  7:41 AM  Result Value Ref Range   Sodium 139 137 - 147 mEq/L   Potassium 3.6 (L) 3.7 - 5.3 mEq/L   Chloride 102 96 - 112 mEq/L   CO2 25 19 - 32 mEq/L   Glucose, Bld 84 70 - 99 mg/dL   BUN 24 (H) 6 - 23 mg/dL   Creatinine, Ser 1.34 0.50 - 1.35 mg/dL   Calcium 10.9 (H) 8.4 - 10.5 mg/dL   GFR calc non Af Amer 56 (L) >90 mL/min   GFR calc Af Amer 65 (L) >90 mL/min    Comment: (NOTE) The eGFR has been calculated using the CKD EPI equation. This calculation has not been validated in all clinical situations. eGFR's persistently <90 mL/min signify possible Chronic Kidney Disease.    Anion gap 12 5 - 15  Heparin level (unfractionated)     Status: Abnormal   Collection Time: 06/30/2014  4:48 PM  Result Value Ref Range   Heparin Unfractionated 0.13 (L) 0.30 - 0.70 IU/mL  Comment:        IF HEPARIN RESULTS ARE BELOW EXPECTED VALUES, AND PATIENT DOSAGE HAS BEEN CONFIRMED, SUGGEST FOLLOW UP TESTING OF ANTITHROMBIN III LEVELS.   Urinalysis, Routine w reflex microscopic     Status: Abnormal   Collection Time: 07/02/2014  8:31 PM  Result Value Ref Range   Color, Urine YELLOW YELLOW   APPearance CLOUDY (A) CLEAR   Specific Gravity, Urine 1.019 1.005 - 1.030   pH 5.5 5.0 - 8.0   Glucose, UA NEGATIVE NEGATIVE mg/dL   Hgb urine dipstick LARGE (A) NEGATIVE   Bilirubin Urine NEGATIVE NEGATIVE   Ketones, ur NEGATIVE NEGATIVE mg/dL   Protein, ur NEGATIVE NEGATIVE mg/dL   Urobilinogen, UA 1.0 0.0 - 1.0 mg/dL   Nitrite  NEGATIVE NEGATIVE   Leukocytes, UA SMALL (A) NEGATIVE  Na and K (sodium & potassium), rand urine     Status: None   Collection Time: 06/24/2014  8:31 PM  Result Value Ref Range   Sodium, Ur 32 mEq/L   Potassium Urine Timed 39 mEq/L  Urine microscopic-add on     Status: Abnormal   Collection Time: 06/30/2014  8:31 PM  Result Value Ref Range   Squamous Epithelial / LPF RARE RARE   WBC, UA 3-6 <3 WBC/hpf   RBC / HPF TOO NUMEROUS TO COUNT <3 RBC/hpf   Bacteria, UA RARE RARE   Casts HYALINE CASTS (A) NEGATIVE   Crystals CA OXALATE CRYSTALS (A) NEGATIVE  CBC     Status: Abnormal   Collection Time: 07/02/14 12:45 AM  Result Value Ref Range   WBC 5.9 4.0 - 10.5 K/uL   RBC 2.82 (L) 4.22 - 5.81 MIL/uL   Hemoglobin 7.8 (L) 13.0 - 17.0 g/dL   HCT 24.3 (L) 39.0 - 52.0 %   MCV 86.2 78.0 - 100.0 fL   MCH 27.7 26.0 - 34.0 pg   MCHC 32.1 30.0 - 36.0 g/dL   RDW 16.8 (H) 11.5 - 15.5 %   Platelets 87 (L) 150 - 400 K/uL    Comment: REPEATED TO VERIFY CONSISTENT WITH PREVIOUS RESULT   Phosphorus     Status: None   Collection Time: 07/02/14 12:45 AM  Result Value Ref Range   Phosphorus 2.8 2.3 - 4.6 mg/dL  Basic metabolic panel     Status: Abnormal   Collection Time: 07/02/14 12:45 AM  Result Value Ref Range   Sodium 138 137 - 147 mEq/L   Potassium 3.5 (L) 3.7 - 5.3 mEq/L   Chloride 101 96 - 112 mEq/L   CO2 23 19 - 32 mEq/L   Glucose, Bld 83 70 - 99 mg/dL   BUN 21 6 - 23 mg/dL   Creatinine, Ser 1.31 0.50 - 1.35 mg/dL   Calcium 10.8 (H) 8.4 - 10.5 mg/dL   GFR calc non Af Amer 58 (L) >90 mL/min   GFR calc Af Amer 67 (L) >90 mL/min    Comment: (NOTE) The eGFR has been calculated using the CKD EPI equation. This calculation has not been validated in all clinical situations. eGFR's persistently <90 mL/min signify possible Chronic Kidney Disease.    Anion gap 14 5 - 15  Save smear     Status: None   Collection Time: 07/02/14 12:45 AM  Result Value Ref Range   Smear Review SMEAR STAINED AND  AVAILABLE FOR REVIEW   Heparin level (unfractionated)     Status: None   Collection Time: 07/02/14 12:45 AM  Result Value Ref Range   Heparin  Unfractionated 0.33 0.30 - 0.70 IU/mL    Comment:        IF HEPARIN RESULTS ARE BELOW EXPECTED VALUES, AND PATIENT DOSAGE HAS BEEN CONFIRMED, SUGGEST FOLLOW UP TESTING OF ANTITHROMBIN III LEVELS.   Magnesium     Status: Abnormal   Collection Time: 07/02/14 12:45 AM  Result Value Ref Range   Magnesium 1.3 (L) 1.5 - 2.5 mg/dL  CBC     Status: Abnormal   Collection Time: 07/02/14  8:40 AM  Result Value Ref Range   WBC 5.8 4.0 - 10.5 K/uL   RBC 2.81 (L) 4.22 - 5.81 MIL/uL   Hemoglobin 7.8 (L) 13.0 - 17.0 g/dL    Comment: CONSISTENT WITH PREVIOUS RESULT   HCT 24.5 (L) 39.0 - 52.0 %   MCV 87.2 78.0 - 100.0 fL   MCH 27.8 26.0 - 34.0 pg   MCHC 31.8 30.0 - 36.0 g/dL   RDW 16.6 (H) 11.5 - 15.5 %   Platelets 94 (L) 150 - 400 K/uL    Comment: CONSISTENT WITH PREVIOUS RESULT  Heparin level (unfractionated)     Status: None   Collection Time: 07/02/14  8:50 AM  Result Value Ref Range   Heparin Unfractionated 0.39 0.30 - 0.70 IU/mL    Comment:        IF HEPARIN RESULTS ARE BELOW EXPECTED VALUES, AND PATIENT DOSAGE HAS BEEN CONFIRMED, SUGGEST FOLLOW UP TESTING OF ANTITHROMBIN III LEVELS.   Glucose, capillary     Status: Abnormal   Collection Time: 07/02/14 12:05 PM  Result Value Ref Range   Glucose-Capillary 65 (L) 70 - 99 mg/dL  Glucose, capillary     Status: None   Collection Time: 07/02/14 12:52 PM  Result Value Ref Range   Glucose-Capillary 94 70 - 99 mg/dL   Dg Chest 2 View  07/02/2014   CLINICAL DATA:  DVT in the left lower extremity.  Anemia.  Smoker.  EXAM: CHEST  2 VIEW  COMPARISON:  None.  FINDINGS: The lungs are clear. Heart size is upper normal. No pneumothorax or pleural effusion. No focal bony abnormality.  IMPRESSION: No acute disease.   Electronically Signed   By: Inge Rise M.D.   On: 07/02/2014 13:52   US  Renal  07/02/2014   CLINICAL DATA:  Hematuria.  Question of malignancy/mass.  EXAM: RENAL/URINARY TRACT ULTRASOUND COMPLETE  COMPARISON:  None.  FINDINGS: Right Kidney:  Length: 11.6 cm. Parenchyma is echogenic. No focal mass or hydronephrosis identified.  Left Kidney:  Length: 10.4 cm. Echogenic renal parenchyma. There is moderate left hydronephrosis without cortical thinning. No focal mass identified. Proximal ureter appears dilated.  Bladder:  Within the urinary bladder multiple stones are identified. Stones measure up to 1.6 cm. Additionally there is rounded soft tissue density within the bladder, shown to be vascular on Doppler evaluation suspicious for urothelial mass.  Additional findings:  There is ascites in the abdomen. Gallbladder sludge is incidentally noted.  IMPRESSION: 1. Marked left hydronephrosis and probable dilatation of the proximal left ureter. 2. Suspect stones and possible urothelial lesion within the bladder. Recommend CT of the abdomen and pelvis without and with contrast for further evaluation. 3. Ascites. 4. Gallbladder sludge.   Electronically Signed   By: Shon Hale M.D.   On: 07/02/2014 14:41    Assessment: 57 y.o. male admitted with left LE DVT on IV heparin, now with acute onset altered mental status, dysconjugate gaze and asymmetric pupils. Concern for acute hemorrhagic heparin related ICH. No a candidate for  IV thrombolysis due to IV heparin STAT CT brain without contrast. Will follow up.   Dorian Pod ,MD [facility] [phone]  07/02/2014, 6:31 PM

## 2014-07-02 NOTE — Progress Notes (Signed)
eLink Physician-Brief Progress Note Patient Name: Jarris Savard DOB: 01/23/1956 MRN: [id number]   Date of Service  07/02/2014  HPI/Events of Note  Mr Jason FilaFlack's family has requested that his code status be DNR. I have changed the orders to reflect this  eICU Interventions       Intervention Category Intermediate Interventions: Other:  BYRUM,ROBERT S. 07/02/2014, 11:03 PM

## 2014-07-02 NOTE — Progress Notes (Signed)
MD paged, bleeding unable to be stopped, team to assess pt. Corey Glenn,Corey Glenn S 2:49 PM

## 2014-07-02 NOTE — Significant Event (Signed)
Rapid Response Event Note  Overview:  unresponsiveness      Initial Focused Assessment:  Called by RN for patient unresponsive.  Upon my arrival to patients room, RN at bedside.  Vitals OK, Slightly hypertensive, moans/ withdraws to painful stimuli.  Pupils right gaze, tremors noted in left arm and leg.  Not following commands or communicating.  Patient was incontinent. MD at bedside.  LSN by RN at 1500.   Interventions:  Patient placed on nasal cannula.  Heparin drip turned off, ABG done.  Dr Cyril Mourningamillo at bedside, Stat CT scan ordered.    Event Summary:  Patient transferred to 3 M 10, Family updated by RN.   at      at          [facility], Corey Glenn

## 2014-07-02 NOTE — Progress Notes (Signed)
Heparin drip stopped per MD. Darrel HooverWilson,Maceo Hernan S 6:25 PM

## 2014-07-02 NOTE — Consult Note (Signed)
Reason for Consult:ICH/IVH Referring Physician: hospitalist  Corey Glenn is an 58 y.o. male.   HPI:  58 year old male with a history of alcoholism and 30 pounds weight loss since July who was admitted with a extensive lower extremity DVT. He was thrombocytopenic and had a hemoglobin of 3.9 on admission. He was scheduled for endoscopy tomorrow as the concern is that he likely has cancer. He was on heparin for DVT and had an acute mental status change late this afternoon. Head CT showed a large intercerebral hemorrhage on the left with intraventricular extension and hydrocephalus. He was transferred to the ICU in neurosurgical evaluation was requested. He has just been intubated by critical care medicine. The patient is unable to cooperate with history and physical.  Past Medical History  Diagnosis Date  . Chronic foot pain   . Chronic ankle pain     left  . Posterior tibial tendon dysfunction   . Chronic leg pain     left    Past Surgical History  Procedure Laterality Date  . Leg surgery Left     Open treatment internal fixation left ankle 25 years ago    Allergies  Allergen Reactions  . Aspirin Nausea And Vomiting    History  Substance Use Topics  . Smoking status: Current Every Day Smoker -- 0.50 packs/day for 25 years    Types: Cigarettes  . Smokeless tobacco: Never Used  . Alcohol Use: Yes     Comment: approx 10-40 oz beers a week     History reviewed. No pertinent family history.   Review of Systems  Positive ROS: Unable to obtain but his brother told me he had lost 30 pounds of weight since July  All other systems have been reviewed and were otherwise negative with the exception of those mentioned in the HPI and as above.  Objective: Vital signs in last 24 hours: Temp:  [97.8 F (36.6 C)-98.1 F (36.7 C)] 97.8 F (36.6 C) (11/26 1905) Pulse Rate:  [69-104] 82 (11/26 2025) Resp:  [17-31] 31 (11/26 2015) BP: (114-178)/(71-104) 168/92 mmHg (11/26 2015) SpO2:   [94 %-100 %] 100 % (11/26 2025) FiO2 (%):  [100 %] 100 % (11/26 2025)  General Appearance: Somnolent, appears stated age Head: Normocephalic, without obvious abnormality, atraumatic Eyes: Pupils 2 mm and unreactive     Neck: Supple, symmetrical, trachea midline Lungs: Son are S respirations Heart: Regular rate and rhythm   NEUROLOGIC:   Mental status: Somnolent, does not open eyes, some flexion on the left to deep painful stimuli with no movement on the right, no verbal output Motor Exam - appears to be right hemiplegic Sensory Exam - unable to test Coordination - unable to test Gait - unable to test Balance - unable to test Cranial Nerves: I: smell Not tested  II: visual acuity  OS: na    OD: na  II: visual fields   II: pupils  2 mm and unreactive   III,VII: ptosis   III,IV,VI: extraocular muscles   disconjugate gaze   V: mastication   V: facial light touch sensation    V,VII: corneal reflex   present   VII: facial muscle function - upper    VII: facial muscle function - lower   VIII: hearing   IX: soft palate elevation    IX,X: gag reflex  present   XI: trapezius strength    XI: sternocleidomastoid strength   XI: neck flexion strength    XII: tongue strength  Data Review Lab Results  Component Value Date   WBC 5.8 07/02/2014   HGB 7.8* 07/02/2014   HCT 24.5* 07/02/2014   MCV 87.2 07/02/2014   PLT 94* 07/02/2014   Lab Results  Component Value Date   NA 138 07/02/2014   K 3.5* 07/02/2014   CL 101 07/02/2014   CO2 23 07/02/2014   BUN 21 07/02/2014   CREATININE 1.31 07/02/2014   GLUCOSE 83 07/02/2014   Lab Results  Component Value Date   INR 1.43 06/24/2014    Radiology: No results found. CT scan of the head shows a large left intracerebral hemorrhage in the semi-centrum ovale extending into the ventricles on the left to fill the third ventricle. There is hydrocephalus. There is diffuse atrophy. There is minimal midline shift. 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 Even without the likely diagnosis of cancer the prognosis is quite grim. Certainly the likelihood of a good functional survival is quite small. We talked about placing a ventriculostomy drain and he is receiving protamine to reverse his heparin at this point. The patient's brother is a Engineer, civil (consulting)nurse who works in administration here at the hospital and I put a long discussion with him and gone over the CT scan with him in detail. At this point we will leave him a full code for the time being and leave him intubated but his brother does not want to move forward with ventriculostomy drain placement. He will likely choose comfort measures only. I think this is a very reasonable decision as there is some chance of prolonging the quantity of life in this situation but I think the quality of life would be fairly poor (right hemiplegia, cognitive deficits and possible aphasia) especially if he is diagnosed with cancer.   JONES,DAVID S 07/02/2014 8:50 PM

## 2014-07-02 NOTE — Plan of Care (Signed)
Problem: Phase I Progression Outcomes Goal: Pain controlled with appropriate interventions Outcome: Progressing Goal: OOB as tolerated unless otherwise ordered Outcome: Not Progressing Goal: Initial discharge plan identified Outcome: Progressing Goal: Hemodynamically stable Outcome: Completed/Met Date Met:  07/02/14  Problem: Phase II Progression Outcomes Goal: Hemodynamically stable Outcome: Progressing

## 2014-07-02 NOTE — Consult Note (Signed)
PULMONARY / CRITICAL CARE MEDICINE   Name: Oaklyn Blanchie ServeFlacks MRN: [id number] DOB: March 27, 1956    ADMISSION DATE:  01-18-2014 CONSULTATION DATE:  07/02/2014  REFERRING MD :  Heide SparkNarendra  CHIEF COMPLAINT:  AMS  INITIAL PRESENTATION: 58 year old male with no known medical history presented 11/23 with LLE pain. Found to have DVT. Admitted to hospitalist service. Also noted to be anemic with unexpected 15lb weight loss. He was undergoing eval and treatment for these things when he was noted to be unresponsive on medical floor 11/26. He was intubated and transferred to ICU. CT head showed large ICH. PCCM to assume care.   STUDIES:  11/26 CT head > Extensive hemorrhage with extensive intraventricular extension of hemorrhage.There is evidence of hydrocephalus. No midline shift. 11/26 Renal US > Marked left hydronephrosis and probable dilatation of the proximal left ureter. Suspect stones and possible urothelial lesion within the bladder. Recommend CT of the abdomen and pelvis without and with contrast for further evaluation. Ascites. Gallbladder sludge.  SIGNIFICANT EVENTS: 11/23 admitted for DVT 11/26 ICH, to ICU on vent.   HISTORY OF PRESENT ILLNESS:  58 year old male with no significant PMH presented to ED 11/23 complaining of LLE pain and edema. Also noted to have 15 lb weight loss over past 2 months unintentionally. In ED he was also found to be profoundly anemic and in ARF. He was admitted and underwent continued workup and treatment for these issues. 11/26 hospital course was complicated by acute alteration in mental status. He was found to be unresponsive by RN on medical floor. He was intubated and transferred to ICU. CT head was obtained which depicted a large ICH with intraventricular extension. PCCM to assume care.   PAST MEDICAL HISTORY :   has a past medical history of Chronic foot pain; Chronic ankle pain; Posterior tibial tendon dysfunction; and Chronic leg pain.  has past surgical history  that includes Leg Surgery (Left). Prior to Admission medications   Medication Sig Start Date End Date Taking? Authorizing Provider  Acetaminophen (TYLENOL PO) Take 1-2 tablets by mouth daily as needed (for pain).   Yes Historical Provider, MD  HYDROcodone-acetaminophen (NORCO) 10-325 MG per tablet Take 1 tablet by mouth every 6 (six) hours as needed for moderate pain.   Yes Historical Provider, MD   Allergies  Allergen Reactions  . Aspirin Nausea And Vomiting    FAMILY HISTORY:  has no family status information on file.  SOCIAL HISTORY:  reports that he has been smoking Cigarettes.  He has a 12.5 pack-year smoking history. He has never used smokeless tobacco. He reports that he drinks alcohol. He reports that he does not use illicit drugs.  REVIEW OF SYSTEMS: Unable, intubated  SUBJECTIVE:   VITAL SIGNS: Temp:  [97.8 F (36.6 C)-98.1 F (36.7 C)] 97.8 F (36.6 C) (11/26 1905) Pulse Rate:  [69-104] 82 (11/26 2025) Resp:  [17-31] 31 (11/26 2015) BP: (114-178)/(71-104) 168/92 mmHg (11/26 2015) SpO2:  [94 %-100 %] 100 % (11/26 2025) FiO2 (%):  [100 %] 100 % (11/26 2025) HEMODYNAMICS:   VENTILATOR SETTINGS: Vent Mode:  [-] PRVC FiO2 (%):  [100 %] 100 % Set Rate:  [15 bmp] 15 bmp Vt Set:  [600 mL] 600 mL PEEP:  [5 cmH20] 5 cmH20 Plateau Pressure:  [18 cmH20] 18 cmH20 INTAKE / OUTPUT:  Intake/Output Summary (Last 24 hours) at 07/02/14 2043 Last data filed at 07/02/14 1343  Gross per 24 hour  Intake    240 ml  Output  1100 ml  Net   -860 ml    PHYSICAL EXAMINATION: General:  Cachectic male, unresponsive Neuro:  Obtunded on vent HEENT:  Poor dentition, malodorous oral secretions. [place]/AT, No JVD noted Cardiovascular:  RRR, no MRG noted Lungs:  Diffuse rhonchi Abdomen:  Soft, non-distended Musculoskeletal:  No acute deformity Skin:  LLE Edema, otherwise intact  LABS:  CBC  Recent Labs Lab 06/23/2014 0346 07/02/14 0045 07/02/14 0840  WBC 6.3 5.9 5.8  HGB 8.1*  7.8* 7.8*  HCT 24.6* 24.3* 24.5*  PLT 84* 87* 94*   Coag's  Recent Labs Lab 06/30/2014 1100  INR 1.43   BMET  Recent Labs Lab 06/26/2014 0930 06/23/2014 0741 07/02/14 0045  NA 139 139 138  K 3.3* 3.6* 3.5*  CL 103 102 101  CO2  --  25 23  BUN 24* 24* 21  CREATININE 1.70* 1.34 1.31  GLUCOSE 106* 84 83   Electrolytes  Recent Labs Lab 06/08/2014 0741 07/02/14 0045  CALCIUM 10.9* 10.8*  MG  --  1.3*  PHOS  --  2.8   Sepsis Markers No results for input(s): LATICACIDVEN, PROCALCITON, O2SATVEN in the last 168 hours. ABG  Recent Labs Lab 07/02/14 1830  PHART 7.437  PCO2ART 38.3  PO2ART 92.2   Liver Enzymes  Recent Labs Lab 07/06/2014 0346  AST 5  ALT <5  ALKPHOS 89  BILITOT 1.1  ALBUMIN 2.2*   Cardiac Enzymes No results for input(s): TROPONINI, PROBNP in the last 168 hours. Glucose  Recent Labs Lab 07/02/14 1205 07/02/14 1252 07/02/14 1755  GLUCAP 65* 94 92    Imaging No results found.   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  - DNR  - Nicardipine infusion to maintain SBP < 140  - D/c heparin, Give protamine - D/c IVC filter placement - RASS goal: 0 - PRN lorazepam and sublimaze for sedation  Acute respiratory failure 2/2 failure to protect airway in setting of ICH - Full vent support - CXR - ABG - VAP prevention per protocol  AKI (improved) Hydronephrosis Hypercalcemia - Gentle IVF hydration  Aspiration pneumonitis - Abx: Zosyn, start date 11/26, day 0  FAMILY  - Updates: 11/26 Family (including Brother, Ron Janalyn ShyFlack [employer] [facility]) updated at bedside by Dr. Sung AmabileSimonds. They understand the severity of this event in that it is not survivable.  Have decided to forgo aggressive management and place DNR order into affect. Plan to withdrawal care and pursue comfort measures 11/27 AM.  Joneen RoachPaul Hoffman, ACNP [facility] Pager [phone] or [phone]     PCCM ATTENDING: Pt seen on work rounds with care provider noted above. We reviewed pt's initial presentation, consultants notes and hospital database in detail.  The above assessment and plan was formulated under my direction.  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  

## 2014-07-03 ENCOUNTER — Encounter (HOSPITAL_COMMUNITY): Admission: EM | Disposition: E | Payer: Self-pay | Source: Home / Self Care | Attending: Pulmonary Disease

## 2014-07-03 DIAGNOSIS — I610 Nontraumatic intracerebral hemorrhage in hemisphere, subcortical: Secondary | ICD-10-CM

## 2014-07-03 DIAGNOSIS — F17200 Nicotine dependence, unspecified, uncomplicated: Secondary | ICD-10-CM | POA: Insufficient documentation

## 2014-07-03 DIAGNOSIS — I619 Nontraumatic intracerebral hemorrhage, unspecified: Secondary | ICD-10-CM | POA: Diagnosis present

## 2014-07-03 DIAGNOSIS — I82402 Acute embolism and thrombosis of unspecified deep veins of left lower extremity: Principal | ICD-10-CM

## 2014-07-03 DIAGNOSIS — D696 Thrombocytopenia, unspecified: Secondary | ICD-10-CM

## 2014-07-03 LAB — CBC
HCT: 22.1 % — ABNORMAL LOW (ref 39.0–52.0)
HEMOGLOBIN: 7.1 g/dL — AB (ref 13.0–17.0)
MCH: 28.2 pg (ref 26.0–34.0)
MCHC: 32.1 g/dL (ref 30.0–36.0)
MCV: 87.7 fL (ref 78.0–100.0)
PLATELETS: 85 10*3/uL — AB (ref 150–400)
RBC: 2.52 MIL/uL — AB (ref 4.22–5.81)
RDW: 16.6 % — ABNORMAL HIGH (ref 11.5–15.5)
WBC: 5.9 10*3/uL (ref 4.0–10.5)

## 2014-07-03 LAB — COMPREHENSIVE METABOLIC PANEL
ALBUMIN: 2 g/dL — AB (ref 3.5–5.2)
ALT: <5 U/L (ref 0–53)
AST: <5 U/L (ref 0–37)
Alkaline Phosphatase: 88 U/L (ref 39–117)
Anion gap: 17 — ABNORMAL HIGH (ref 5–15)
BUN: 20 mg/dL (ref 6–23)
CO2: 22 mEq/L (ref 19–32)
CREATININE: 1.23 mg/dL (ref 0.50–1.35)
Calcium: 10.4 mg/dL (ref 8.4–10.5)
Chloride: 103 mEq/L (ref 96–112)
GFR calc Af Amer: 72 mL/min — ABNORMAL LOW (ref >90)
GFR calc non Af Amer: 62 mL/min — ABNORMAL LOW (ref >90)
Glucose, Bld: 99 mg/dL (ref 70–99)
POTASSIUM: 3.7 meq/L (ref 3.7–5.3)
Sodium: 142 mEq/L (ref 137–147)
TOTAL PROTEIN: 5.5 g/dL — AB (ref 6.0–8.3)
Total Bilirubin: 0.7 mg/dL (ref 0.3–1.2)

## 2014-07-03 LAB — PTH, INTACT AND CALCIUM
Calcium, Total (PTH): 10.2 mg/dL (ref 8.4–10.5)
PTH: 3 pg/mL — ABNORMAL LOW (ref 14–64)

## 2014-07-03 SURGERY — COLONOSCOPY
Anesthesia: Monitor Anesthesia Care

## 2014-07-03 SURGERY — ESOPHAGOGASTRODUODENOSCOPY (EGD) WITH PROPOFOL
Anesthesia: Monitor Anesthesia Care

## 2014-07-03 MED ORDER — PHENYTOIN SODIUM 50 MG/ML IJ SOLN
100.0000 mg | Freq: Three times a day (TID) | INTRAMUSCULAR | Status: DC
  Administered 2014-07-03 – 2014-07-04 (×2): 100 mg via INTRAVENOUS
  Filled 2014-07-03 (×7): qty 2

## 2014-07-03 MED ORDER — SODIUM CHLORIDE 0.9 % IV SOLN
1000.0000 mg | INTRAVENOUS | Status: AC
  Administered 2014-07-03: 1000 mg via INTRAVENOUS
  Filled 2014-07-03: qty 20

## 2014-07-03 NOTE — Progress Notes (Signed)
STROKE TEAM PROGRESS NOTE   HISTORY Corey Glenn is a 58 y.o. male with chronic foot pain, admitted with left LE DVT who has been on IV heparin and doing well until around 6 pm today when his nurse found him with altered mental status, unable to respond to verbal or painful commands. Evaluation by internal medicine resident revealed a dysconjugate gaze and neuro was asked to see patient for further evaluation. IV heparin was stopped promptly. Was given protamine for reversal. At this moment, patient is unresponsive to painful stimuli.  Date last known well: 07/02/14 Time last known well: 3 pm tPA Given: no, IV heparin  .   SUBJECTIVE (INTERVAL HISTORY) His RNis at the bedside.  His exam remains poor and neurosurgery feel ventriculostomy is unlikely to help him significantly. BP adequately controlled.Family has made him DNR and may consider palliative care  OBJECTIVE Temp:  [97.8 F (36.6 C)-99.5 F (37.5 C)] 98.1 F (36.7 C) (11/27 1401) Pulse Rate:  [60-104] 74 (11/27 1401) Cardiac Rhythm:  [-] Normal sinus rhythm (11/27 0800) Resp:  [13-31] 16 (11/27 1401) BP: (102-193)/(61-104) 102/73 mmHg (11/27 1401) SpO2:  [94 %-100 %] 99 % (11/27 1401) FiO2 (%):  [50 %-100 %] 50 % (11/27 1400)   Recent Labs Lab 07/02/14 1205 07/02/14 1252 07/02/14 1755  GLUCAP 65* 94 92    Recent Labs Lab 06/19/2014 0930 09-24-2013 0741 07/02/14 0045 06/28/2014 0212  NA 139 139 138 142  K 3.3* 3.6* 3.5* 3.7  CL 103 102 101 103  CO2  --  25 23 22   GLUCOSE 106* 84 83 99  BUN 24* 24* 21 20  CREATININE 1.70* 1.34 1.31 1.23  CALCIUM  --  10.9* 10.8*  10.2 10.4  MG  --   --  1.3*  --   PHOS  --   --  2.8  --     Recent Labs Lab 09-24-2013 0346 06/19/2014 0212  AST 5 <5  ALT <5 <5  ALKPHOS 89 88  BILITOT 1.1 0.7  PROT 5.9* 5.5*  ALBUMIN 2.2* 2.0*    Recent Labs Lab 06/23/2014 0921  07/06/2014 2322 06/30/14 0705  06/30/14 1930 09-24-2013 0346 07/02/14 0045 07/02/14 0840 06/25/2014 0212  WBC  5.1  --  5.4 5.7  < > 5.8 6.3 5.9 5.8 5.9  NEUTROABS 3.6  --  3.7 3.8  --   --   --   --   --   --   HGB 3.9*  < > 5.6* 6.8*  < > 7.6* 8.1* 7.8* 7.8* 7.1*  HCT 12.8*  < > 17.0* 20.7*  < > 22.6* 24.6* 24.3* 24.5* 22.1*  MCV 90.8  --  88.1 87.7  < > 86.3 86.6 86.2 87.2 87.7  PLT 91*  --  84* 78*  < > 86* 84* 87* 94* 85*  < > = values in this interval not displayed. No results for input(s): CKTOTAL, CKMB, CKMBINDEX, TROPONINI in the last 168 hours. No results for input(s): LABPROT, INR in the last 72 hours.  Recent Labs  09-24-2013 2031  COLORURINE YELLOW  LABSPEC 1.019  PHURINE 5.5  GLUCOSEU NEGATIVE  HGBUR LARGE*  BILIRUBINUR NEGATIVE  KETONESUR NEGATIVE  PROTEINUR NEGATIVE  UROBILINOGEN 1.0  NITRITE NEGATIVE  LEUKOCYTESUR SMALL*    No results found for: CHOL, TRIG, HDL, CHOLHDL, VLDL, LDLCALC No results found for: HGBA1C    Component Value Date/Time   LABOPIA POSITIVE* 06/26/2014 1355   COCAINSCRNUR NONE DETECTED 07/04/2014 1355   LABBENZ NONE DETECTED  07/06/2014 1355   AMPHETMU NONE DETECTED 07/02/2014 1355   THCU NONE DETECTED 06/27/2014 1355   LABBARB NONE DETECTED 06/18/2014 1355    No results for input(s): ETH in the last 168 hours.  Dg Chest 2 View 07/02/2014    No acute disease.    Ct Head Wo Contrast 07/02/2014    Extensive hemorrhage which appears to arise from the left centrum semiovale with extensive intraventricular extension of hemorrhage. There is evidence of hydrocephalus. There is extensive supratentorial small vessel disease. No midline shift.    US Renal 07/02/2014    1. Marked left hydronephrosis and probable dilatation of the proximal left ureter.  2. Suspect stones and possible urothelial lesion within the bladder.  Recommend CT of the abdomen and pelvis without and with contrast for further evaluation.  3. Ascites.  4. Gallbladder sludge.     Dg Chest Port 1 View 07/02/2014    Endotracheal tube as described without pneumothorax. No edema or  consolidation.        PHYSICAL EXAM Elderly african [place] male intubated.  . Afebrile. Head is nontraumatic. Neck is supple without bruit. . Cardiac exam no murmur or gallop. Lungs are clear to auscultation. Distal pulses are well felt.  Neurological Exam : intubated unresponsive comatose. Eyes closed barely responds to sternal rub with partial flexion of left arm and leg. Pupils 2 mm sluggishly reactive. Dysconjugate gaze with right eye exotropia. Fundi not visualized.Right lower face weak. Tongue midline. Right hemiplegia.withdraws left side minimally to pain.Left plantar downgoing and right upgoing ASSESSMENT/PLAN Mr. Corey Glenn is a 58 y.o. male with history of left lower extremity DVT with IV heparin, tobacco use, alcohol use, and chronic lower extremity pain, presenting with altered mental status and inability to respond.  He did not receive IV t-PA due to a hemorrhagic stroke.  Stroke: Dominant - Extensive hemorrhage which appears to arise from the left centrum semiovale with extensive intraventricular extension of hemorrhage.   Resultant  Comatose state with right hemiplegia  MRI  not ordered  MRA  not ordered  Carotid Doppler  not ordered  2D Echo  not ordered  LDL not ordered  HgbA1c not ordered  VTE prophylaxis - none at this time. Known left lower extremity DVT. 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  Recent 30 pound weight loss.- Malnutrition  Other Pertinent History  Aspirin  allergy  DNR  Hospital day # 4   I have personally examined this patient, reviewed notes, independently viewed imaging studies, participated in medical decision making and plan of care. I have made any additions or clarifications directly to the above note. Agree with note above. Patient`s prognosis seems quite poor given his exam and CT findings and baseline medical issues.D/W Dr Yetta Barre and Vassie Loll  This patient is critically ill and at significant risk of neurological worsening, death and care requires constant monitoring of vital signs, hemodynamics,respiratory and cardiac monitoring,review of multiple databases, neurological assessment, discussion with family, other specialists and medical decision making of high complexity.I have made any additions or clarifications directly  to the above note.  I spent 35 minutes of neurocritical care time  in the care of  this patient.  Delia HeadyPramod Clever Geraldo, MD Medical Director [facility] Pager: [phone] 06/12/2014 5:28 PM     To contact Stroke Continuity provider, please refer to [email or web address]. After hours, contact General Neurology

## 2014-07-03 NOTE — Progress Notes (Signed)
Subjective: Patient remains intubated  Objective: Vital signs in last 24 hours: Temp:  [97.8 F (36.6 C)-99.5 F (37.5 C)] 98.7 F (37.1 C) (11/27 0800) Pulse Rate:  [60-104] 75 (11/27 0845) Resp:  [13-31] 15 (11/27 0845) BP: (108-193)/(61-104) 122/72 mmHg (11/27 0845) SpO2:  [94 %-100 %] 100 % (11/27 0845) FiO2 (%):  [50 %-100 %] 50 % (11/27 0800)  Intake/Output from previous day: 11/26 0730 - 11/27 0729 In: 1140 [P.O.:240; I.V.:800; IV Piggyback:100] Out: 1525 [Urine:875; Emesis/NG output:650] Intake/Output this shift: Total I/O In: 200 [I.V.:200] Out: 60 [Urine:60]  He does not open his eyes to deep noxious stimuli, he does have some flexion on the left and actually a slight amount of flexion in the right hand to noxious stimuli  Lab Results: Lab Results  Component Value Date   WBC 5.9 06/27/2014   HGB 7.1* 06/11/2014   HCT 22.1* 07/06/2014   MCV 87.7 06/11/2014   PLT 85* 06/27/2014   Lab Results  Component Value Date   INR 1.43 2014/02/14   BMET Lab Results  Component Value Date   NA 142 06/17/2014   K 3.7 06/23/2014   CL 103 07/02/2014   CO2 22 06/10/2014   GLUCOSE 99 06/28/2014   BUN 20 06/23/2014   CREATININE 1.23 06/17/2014   CALCIUM 10.4 06/22/2014    Studies/Results: Dg Chest 2 View  07/02/2014   CLINICAL DATA:  DVT in the left lower extremity.  Anemia.  Smoker.  EXAM: CHEST  2 VIEW  COMPARISON:  None.  FINDINGS: The lungs are clear. Heart size is upper normal. No pneumothorax or pleural effusion. No focal bony abnormality.  IMPRESSION: No acute disease.   Electronically Signed   By: Drusilla Kannerhomas  Dalessio M.D.   On: 07/02/2014 13:52   Ct Head Wo Contrast  07/02/2014   ADDENDUM REPORT: 07/02/2014 20:58  ADDENDUM: Multiple attempts to communicate this critical value directly to Dr. Cyril Mourningamillo have been unsuccessful. By report from the floor nurse, Dr. Cyril Mourningamillo viewed the study directly and arranged for transfer to the Neurosurgery service.   Electronically  Signed   By: Bretta BangWilliam  Woodruff M.D.   On: 07/02/2014 20:58   07/02/2014   CLINICAL DATA:  Patient unresponsive ; patient on blood thinners  EXAM: CT HEAD WITHOUT CONTRAST  TECHNIQUE: Contiguous axial images were obtained from the base of the skull through the vertex without intravenous contrast.  COMPARISON:  None.  FINDINGS: There is hemorrhage which appears to arise from the left centrum semiovale with extensive intraventricular extension. Hemorrhage is seen throughout much of both lateral ventricles as well as throughout portions of the third ventricle. There is moderate generalized ventricular enlargement. There is no midline shift. There is no subdural or epidural fluid. There is extensive small vessel disease in the centra semiovale bilaterally. The bony calvarium appears intact. The mastoid air cells are clear.  IMPRESSION: Extensive hemorrhage which appears to arise from the left centrum semiovale with extensive intraventricular extension of hemorrhage. There is evidence of hydrocephalus. There is extensive supratentorial small vessel disease. No midline shift.  Electronically Signed: By: Bretta BangWilliam  Woodruff M.D. On: 07/02/2014 19:24   [place] Renal  07/02/2014   CLINICAL DATA:  Hematuria.  Question of malignancy/mass.  EXAM: RENAL/URINARY TRACT ULTRASOUND COMPLETE  COMPARISON:  None.  FINDINGS: Right Kidney:  Length: 11.6 cm. Parenchyma is echogenic. No focal mass or hydronephrosis identified.  Left Kidney:  Length: 10.4 cm. Echogenic renal parenchyma. There is moderate left hydronephrosis without cortical thinning. No focal mass identified. Proximal  ureter appears dilated.  Bladder:  Within the urinary bladder multiple stones are identified. Stones measure up to 1.6 cm. Additionally there is rounded soft tissue density within the bladder, shown to be vascular on Doppler evaluation suspicious for urothelial mass.  Additional findings:  There is ascites in the abdomen. Gallbladder sludge is incidentally  noted.  IMPRESSION: 1. Marked left hydronephrosis and probable dilatation of the proximal left ureter. 2. Suspect stones and possible urothelial lesion within the bladder. Recommend CT of the abdomen and pelvis without and with contrast for further evaluation. 3. Ascites. 4. Gallbladder sludge.   Electronically Signed   By: Rosalie GumsBeth  Brown M.D.   On: 07/02/2014 14:41   Dg Chest Port 1 View  07/02/2014   CLINICAL DATA:  Hypoxia  EXAM: PORTABLE CHEST - 1 VIEW  COMPARISON:  Study obtained earlier in the day  FINDINGS: Endotracheal tube tip is 4.7 cm above the carina. No pneumothorax. There is no edema or consolidation. Heart size and pulmonary vascularity are normal. No adenopathy. No bone lesions.  IMPRESSION: Endotracheal tube as described without pneumothorax. No edema or consolidation.   Electronically Signed   By: Bretta BangWilliam  Woodruff M.D.   On: 07/02/2014 21:26    Assessment/Plan: ICH/IVH in a patient who likely has cancer. Prognosis is grim for functional recovery. I suspect the family will choose comfort care measures and I think this is quite reasonable.   LOS: 4 days    Rayn Shorb S 06/15/2014, 9:37 AM

## 2014-07-03 NOTE — Progress Notes (Signed)
PULMONARY / CRITICAL CARE MEDICINE   Name: Corey Glenn MRN: [id number] DOB: 04-Feb-1956    ADMISSION DATE:  06/30/2014 CONSULTATION DATE:  07/02/2014  REFERRING MD :  Heide Spark  CHIEF COMPLAINT:  AMS  INITIAL PRESENTATION: 58 year old male with no known medical history presented 11/23 with LLE pain. Found to have DVT. Admitted to hospitalist service. Also noted to be anemic with unexpected 15lb weight loss. He was noted to be unresponsive on medical floor 11/26. He was intubated and transferred to ICU. CT head showed large ICH. PCCM to assume care.   STUDIES:  11/26 CT head > Extensive hemorrhage with extensive intraventricular extension of hemorrhage.There is evidence of hydrocephalus. No midline shift. 11/26 Renal US > Marked left hydronephrosis and probable dilatation of the proximal left ureter. Suspect stones and possible urothelial lesion within the bladder. Recommend CT of the abdomen and pelvis without and with contrast for further evaluation. Ascites. Gallbladder sludge.  SIGNIFICANT EVENTS: 11/23 admitted for DVT 11/26 ICH, to ICU on vent., givenprotamine   SUBJECTIVE: remains unresponsive afebrile  VITAL SIGNS: Temp:  [97.8 F (36.6 C)-99.5 F (37.5 C)] 98.7 F (37.1 C) (11/27 0800) Pulse Rate:  [60-104] 75 (11/27 0845) Resp:  [13-31] 15 (11/27 0845) BP: (108-193)/(61-104) 122/72 mmHg (11/27 0845) SpO2:  [94 %-100 %] 100 % (11/27 0845) FiO2 (%):  [50 %-100 %] 50 % (11/27 0800) HEMODYNAMICS:   VENTILATOR SETTINGS: Vent Mode:  [-] PRVC FiO2 (%):  [50 %-100 %] 50 % Set Rate:  [15 bmp] 15 bmp Vt Set:  [600 mL] 600 mL PEEP:  [5 cmH20] 5 cmH20 Plateau Pressure:  [15 cmH20-18 cmH20] 15 cmH20 INTAKE / OUTPUT:  Intake/Output Summary (Last 24 hours) at 07/05/2014 4098 Last data filed at 06/20/2014 0800  Gross per 24 hour  Intake   1100 ml  Output   1235 ml  Net   -135 ml    PHYSICAL EXAMINATION: General:  Cachectic male, unresponsive Neuro:  Obtunded on  vent HEENT:  Poor dentition, malodorous oral secretions. [place]/AT, No JVD noted Cardiovascular:  RRR, no MRG noted Lungs:  Diffuse rhonchi Abdomen:  Soft, non-distended Musculoskeletal:  No acute deformity Skin:  LLE Edema, otherwise intact  LABS:  CBC  Recent Labs Lab 07/02/14 0045 07/02/14 0840 06/08/2014 0212  WBC 5.9 5.8 5.9  HGB 7.8* 7.8* 7.1*  HCT 24.3* 24.5* 22.1*  PLT 87* 94* 85*   Coag's  Recent Labs Lab 06/10/2014 1100  INR 1.43   BMET  Recent Labs Lab July 06, 2014 0741 07/02/14 0045 06/08/2014 0212  NA 139 138 142  K 3.6* 3.5* 3.7  CL 102 101 103  CO2 25 23 22   BUN 24* 21 20  CREATININE 1.34 1.31 1.23  GLUCOSE 84 83 99   Electrolytes  Recent Labs Lab July 06, 2014 0741 07/02/14 0045 06/17/2014 0212  CALCIUM 10.9* 10.8* 10.4  MG  --  1.3*  --   PHOS  --  2.8  --    Sepsis Markers No results for input(s): LATICACIDVEN, PROCALCITON, O2SATVEN in the last 168 hours. ABG  Recent Labs Lab 07/02/14 1830  PHART 7.437  PCO2ART 38.3  PO2ART 92.2   Liver Enzymes  Recent Labs Lab July 06, 2014 0346 06/27/2014 0212  AST 5 <5  ALT <5 <5  ALKPHOS 89 88  BILITOT 1.1 0.7  ALBUMIN 2.2* 2.0*   Cardiac Enzymes No results for input(s): TROPONINI, PROBNP in the last 168 hours. Glucose  Recent Labs Lab 07/02/14 1205 07/02/14 1252 07/02/14 1755  GLUCAP 65* 94  92    Imaging Dg Chest 2 View  07/02/2014   CLINICAL DATA:  DVT in the left lower extremity.  Anemia.  Smoker.  EXAM: CHEST  2 VIEW  COMPARISON:  None.  FINDINGS: The lungs are clear. Heart size is upper normal. No pneumothorax or pleural effusion. No focal bony abnormality.  IMPRESSION: No acute disease.   Electronically Signed   By: Drusilla Kannerhomas  Dalessio M.D.   On: 07/02/2014 13:52   Ct Head Wo Contrast  07/02/2014   ADDENDUM REPORT: 07/02/2014 20:58  ADDENDUM: Multiple attempts to communicate this critical value directly to Dr. Cyril Mourningamillo have been unsuccessful. By report from the floor nurse, Dr. Cyril Mourningamillo  viewed the study directly and arranged for transfer to the Neurosurgery service.   Electronically Signed   By: Bretta BangWilliam  Woodruff M.D.   On: 07/02/2014 20:58   07/02/2014   CLINICAL DATA:  Patient unresponsive ; patient on blood thinners  EXAM: CT HEAD WITHOUT CONTRAST  TECHNIQUE: Contiguous axial images were obtained from the base of the skull through the vertex without intravenous contrast.  COMPARISON:  None.  FINDINGS: There is hemorrhage which appears to arise from the left centrum semiovale with extensive intraventricular extension. Hemorrhage is seen throughout much of both lateral ventricles as well as throughout portions of the third ventricle. There is moderate generalized ventricular enlargement. There is no midline shift. There is no subdural or epidural fluid. There is extensive small vessel disease in the centra semiovale bilaterally. The bony calvarium appears intact. The mastoid air cells are clear.  IMPRESSION: Extensive hemorrhage which appears to arise from the left centrum semiovale with extensive intraventricular extension of hemorrhage. There is evidence of hydrocephalus. There is extensive supratentorial small vessel disease. No midline shift.  Electronically Signed: By: Bretta BangWilliam  Woodruff M.D. On: 07/02/2014 19:24   [place] Renal  07/02/2014   CLINICAL DATA:  Hematuria.  Question of malignancy/mass.  EXAM: RENAL/URINARY TRACT ULTRASOUND COMPLETE  COMPARISON:  None.  FINDINGS: Right Kidney:  Length: 11.6 cm. Parenchyma is echogenic. No focal mass or hydronephrosis identified.  Left Kidney:  Length: 10.4 cm. Echogenic renal parenchyma. There is moderate left hydronephrosis without cortical thinning. No focal mass identified. Proximal ureter appears dilated.  Bladder:  Within the urinary bladder multiple stones are identified. Stones measure up to 1.6 cm. Additionally there is rounded soft tissue density within the bladder, shown to be vascular on Doppler evaluation suspicious for urothelial  mass.  Additional findings:  There is ascites in the abdomen. Gallbladder sludge is incidentally noted.  IMPRESSION: 1. Marked left hydronephrosis and probable dilatation of the proximal left ureter. 2. Suspect stones and possible urothelial lesion within the bladder. Recommend CT of the abdomen and pelvis without and with contrast for further evaluation. 3. Ascites. 4. Gallbladder sludge.   Electronically Signed   By: Rosalie GumsBeth  Brown M.D.   On: 07/02/2014 14:41   Dg Chest Port 1 View  07/02/2014   CLINICAL DATA:  Hypoxia  EXAM: PORTABLE CHEST - 1 VIEW  COMPARISON:  Study obtained earlier in the day  FINDINGS: Endotracheal tube tip is 4.7 cm above the carina. No pneumothorax. There is no edema or consolidation. Heart size and pulmonary vascularity are normal. No adenopathy. No bone lesions.  IMPRESSION: Endotracheal tube as described without pneumothorax. No edema or consolidation.   Electronically Signed   By: Bretta BangWilliam  Woodruff M.D.   On: 07/02/2014 21:26     ASSESSMENT / PLAN:  Large intracranial hemorrhage with intraventricular extension LLE DVT Anemia Thrombocytopenia Probable  occult malignancy- (pancytopenia, unprovoked DVT, hypercalcemia, recent wt loss) - Family has decided to forego ventriculostomy - plan for full withdrawal and comfort measures when family erady.  - DNR  - RASS goal: 0 - PRN lorazepam and sublimaze for sedation  Acute respiratory failure 2/2 failure to protect airway in setting of ICH - Full vent support - VAP prevention per protocol  Hypertension  -- Nicardipine infusion to maintain SBP < 140 -attempt to taper off with labetalol & hydralazine  AKI (improved) Hydronephrosis Hypercalcemia - Gentle IVF hydration  Aspiration pneumonitis - Abx: Zosyn, start date 11/26  FAMILY  - Updates: 11/26 Family (including Brother, Ron Janalyn [facility]) updated at bedside by Dr. Sung AmabileSimonds. They understand the severity of this event in that it is not survivable. Have decided  to forgo aggressive management and place DNR order into affect. Plan to withdrawal care and pursue comfort measures 11/27 AM.    Summary -Dr Yetta BarreJones has apprised family of very poor prognosis They have declined IVC drain and plan on discontinuation of vent support after his mother has chance to be here Will make sure he is comfortable and provide support until decision is made to stop   The patient is critically ill with multiple organ systems failure and requires high complexity decision making for assessment and support, frequent evaluation and titration of therapies, application of advanced monitoring technologies and extensive interpretation of multiple databases. Critical Care Time devoted to patient care services described in this note independent of APP time is 31 minutes.   Oretha MilchALVA,Elene Downum V. MD

## 2014-07-03 NOTE — Progress Notes (Signed)
Patient ID: Corey Glenn, male   DOB: 03/18/1956, 58 y.o.   MRN: [id number] Medicine attending: The patient remains intubated and unresponsive. I appreciate ongoing assistance from neurosurgery and pulmonary critical care medicine. I have asked the nurses to call me when the family arrives.

## 2014-07-03 NOTE — Progress Notes (Signed)
Came in room to greet pt's visitor. Pt was having Rt sided seizure (foot jerking up and head jerking to rt.)Pt's family states he was been doing that for "about 5 minutes". MD Yetta BarreJones notified, Ativan given.

## 2014-07-04 DIAGNOSIS — I618 Other nontraumatic intracerebral hemorrhage: Secondary | ICD-10-CM

## 2014-07-04 DIAGNOSIS — R40244 Other coma, without documented Glasgow coma scale score, or with partial score reported: Secondary | ICD-10-CM

## 2014-07-04 DIAGNOSIS — D649 Anemia, unspecified: Secondary | ICD-10-CM

## 2014-07-04 LAB — CBC
HCT: 19.3 % — ABNORMAL LOW (ref 39.0–52.0)
Hemoglobin: 6.5 g/dL — CL (ref 13.0–17.0)
MCH: 30.4 pg (ref 26.0–34.0)
MCHC: 33.7 g/dL (ref 30.0–36.0)
MCV: 90.2 fL (ref 78.0–100.0)
PLATELETS: 94 10*3/uL — AB (ref 150–400)
RBC: 2.14 MIL/uL — ABNORMAL LOW (ref 4.22–5.81)
RDW: 16.5 % — AB (ref 11.5–15.5)
WBC: 5.1 10*3/uL (ref 4.0–10.5)

## 2014-07-04 MED ORDER — FENTANYL CITRATE 0.05 MG/ML IJ SOLN
25.0000 ug | INTRAMUSCULAR | Status: DC | PRN
  Administered 2014-07-04: 50 ug via INTRAVENOUS
  Filled 2014-07-04: qty 2

## 2014-07-04 MED ORDER — LORAZEPAM 2 MG/ML IJ SOLN: 2.0000 mg | INTRAMUSCULAR | Status: DC | PRN

## 2014-07-04 MED ORDER — PHENYTOIN SODIUM 50 MG/ML IJ SOLN
100.0000 mg | Freq: Three times a day (TID) | INTRAMUSCULAR | Status: DC
  Administered 2014-07-04 – 2014-07-07 (×10): 100 mg via INTRAVENOUS
  Filled 2014-07-04 (×12): qty 2

## 2014-07-04 NOTE — Progress Notes (Signed)
PULMONARY / CRITICAL CARE MEDICINE   Name: Corey Glenn MRN: [id number] DOB: [birth year]    ADMISSION DATE:  12/25/2013 CONSULTATION DATE:  07/02/2014  REFERRING MD :  Heide SparkNarendra  CHIEF COMPLAINT:  AMS  INITIAL PRESENTATION: 58 year old male with no known medical history presented 11/23 with LLE pain. Found to have DVT. Admitted to hospitalist service. Also noted to be anemic with unexpected 15lb weight loss. He was noted to be unresponsive on medical floor 11/26. He was intubated and transferred to ICU. CT head showed large ICH. PCCM to assume care.   STUDIES:  11/26 CT head > Extensive hemorrhage with extensive intraventricular extension of hemorrhage.There is evidence of hydrocephalus. No midline shift. 11/26 Renal US > Marked left hydronephrosis and probable dilatation of the proximal left ureter. Suspect stones and possible urothelial lesion within the bladder. Recommend CT of the abdomen and pelvis without and with contrast for further evaluation. Ascites. Gallbladder sludge.  SIGNIFICANT EVENTS: 11/23 admitted for DVT 11/26 ICH, to ICU on vent., givenprotamine   SUBJECTIVE:  remains unresponsive afebrile  VITAL SIGNS: Temp:  [98.1 F (36.7 C)-100 F (37.8 C)] 98.7 F (37.1 C) (11/28 0800) Pulse Rate:  [68-100] 86 (11/28 0800) Resp:  [14-20] 15 (11/28 0800) BP: (102-151)/(67-84) 127/71 mmHg (11/28 0800) SpO2:  [94 %-100 %] 100 % (11/28 0800) FiO2 (%):  [40 %-50 %] 40 % (11/28 0800) HEMODYNAMICS:   VENTILATOR SETTINGS: Vent Mode:  [-] PRVC FiO2 (%):  [40 %-50 %] 40 % Set Rate:  [15 bmp] 15 bmp Vt Set:  [600 mL] 600 mL PEEP:  [5 cmH20] 5 cmH20 Plateau Pressure:  [15 cmH20-20 cmH20] 16 cmH20 INTAKE / OUTPUT:  Intake/Output Summary (Last 24 hours) at 07/04/14 0933 Last data filed at 07/04/14 0800  Gross per 24 hour  Intake 2693.75 ml  Output   1295 ml  Net 1398.75 ml    PHYSICAL EXAMINATION: General:  Cachectic male, unresponsive.  Neuro:  Obtunded on vent.  Has myoclonus to stimulation  HEENT:  Poor dentition, malodorous oral secretions. [place]/AT, No JVD noted Cardiovascular:  RRR, no MRG noted Lungs:  Diffuse rhonchi Abdomen:  Soft, non-distended Musculoskeletal:  No acute deformity Skin:  LLE Edema, otherwise intact  LABS:  CBC  Recent Labs Lab 07/02/14 0045 07/02/14 0840 06/28/2014 0212  WBC 5.9 5.8 5.9  HGB 7.8* 7.8* 7.1*  HCT 24.3* 24.5* 22.1*  PLT 87* 94* 85*    BMET  Recent Labs Lab 06/16/2014 0741 07/02/14 0045 06/15/2014 0212  NA 139 138 142  K 3.6* 3.5* 3.7  CL 102 101 103  CO2 25 23 22   BUN 24* 21 20  CREATININE 1.34 1.31 1.23  GLUCOSE 84 83 99    Imaging No results found.   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  - DNR  - RASS goal: 0 - PRN lorazepam and sublimaze for sedation - Full vent support - VAP prevention per protocol - Abx: Zosyn, start date 11/26  FAMILY  - Updates: 11/26 Family (including Brother, [facility]) updated at bedside by Dr. Sung AmabileSimonds. They understand the severity of this event in that it is not survivable. Have decided to forgo aggressive management and place DNR order  into affect. Plan to withdrawal care and pursue comfort measures 11/27 AM.  NP Summary - Dr Yetta BarreJones has apprised family of very poor prognosis They have declined IVC drain and plan on discontinuation of vent support after his mother has chance to be here Will make sure he is comfortable and provide support until decision is made to stop      Glenn,Corey    PCCM ATTENDING: Pt seen on work rounds with care provider noted  above. We reviewed pt's initial presentation, consultants notes and hospital database in detail.  The above assessment and plan was formulated under my direction.  In summary: Comatose, fully unresponsive. No spont respirations over set rate Per my discussion with brother, Corey Glenn, will extubate He might make some respiratory effort but it will surely be inadequate PRN fentanyl and lorazepam will be available as needed  Corey Fischeravid Eulla Kochanowski, MD;  PCCM service; Mobile [phone]

## 2014-07-04 NOTE — Progress Notes (Signed)
CRITICAL VALUE ALERT  Critical value received:  Hemoglobin 6.5  Date of notification:  07/04/2014  Time of notification:  1030  Critical value read back:Yes.     Nurse who received alert:  Ann MakiMegan Efrain Clauson  MD notified (1st page):  Zenia ResidesPeter Babcock, NP  Time of first page:  1150  MD notified (2nd page):  Time of second page:  Responding MD:  Zenia ResidesPeter Babcock, NP  Time MD responded:  [phone]

## 2014-07-04 NOTE — Progress Notes (Signed)
Patient ID: Corey Glenn, male   DOB: April 27, 1956, 58 y.o.   MRN: [id number] Medicine attending: No acute change. Intermittent tremors of his left upper extremity. He is on Dilantin to control seizures. He is on Cardene to control blood pressure. Intermittent fentanyl for sedation. He remains unresponsive. Pupils small, round, equal, not reactive to light. We have left lines of communication open with the family and are waiting to hear back from them with respect to level of support.

## 2014-07-04 NOTE — Progress Notes (Signed)
STROKE TEAM PROGRESS NOTE   HISTORY Corey Glenn is a 58 y.o. male with chronic foot pain, admitted with left LE DVT who has been on IV heparin and doing well until around 6 pm today when his nurse found him with altered mental status, unable to respond to verbal or painful commands. Evaluation by internal medicine resident revealed a dysconjugate gaze and neuro was asked to see patient for further evaluation. IV heparin was stopped promptly. Was given protamine for reversal. At this moment, patient is unresponsive to painful stimuli.  Date last known well: 07/02/14 Time last known well: 3 pm tPA Given: no, IV heparin  .   SUBJECTIVE (INTERVAL HISTORY) His RN is at the bedside.  His exam remains poor. He is unresponsive. On vent. No acute changes. He has intermittent tremors on stimulation per nurse. Awaiting family input for level of care.   OBJECTIVE Temp:  [98.1 F (36.7 C)-100 F (37.8 C)] 98.7 F (37.1 C) (11/28 0800) Pulse Rate:  [68-100] 86 (11/28 0800) Cardiac Rhythm:  [-] Normal sinus rhythm (11/27 2000) Resp:  [14-20] 15 (11/28 0800) BP: (102-151)/(67-84) 127/71 mmHg (11/28 0800) SpO2:  [94 %-100 %] 100 % (11/28 0800) FiO2 (%):  [40 %-50 %] 40 % (11/28 0800)   Recent Labs Lab 07/02/14 1205 07/02/14 1252 07/02/14 1755  GLUCAP 65* 94 92    Recent Labs Lab 06/13/2014 0930 03-03-14 0741 07/02/14 0045 06/10/2014 0212  NA 139 139 138 142  K 3.3* 3.6* 3.5* 3.7  CL 103 102 101 103  CO2  --  25 23 22   GLUCOSE 106* 84 83 99  BUN 24* 24* 21 20  CREATININE 1.70* 1.34 1.31 1.23  CALCIUM  --  10.9* 10.8*  10.2 10.4  MG  --   --  1.3*  --   PHOS  --   --  2.8  --     Recent Labs Lab 03-03-14 0346 06/14/2014 0212  AST 5 <5  ALT <5 <5  ALKPHOS 89 88  BILITOT 1.1 0.7  PROT 5.9* 5.5*  ALBUMIN 2.2* 2.0*    Recent Labs Lab 06/20/2014 0921  06/09/2014 2322 06/30/14 0705  06/30/14 1930 03-03-14 0346 07/02/14 0045 07/02/14 0840 06/29/2014 0212  WBC 5.1  --  5.4  5.7  < > 5.8 6.3 5.9 5.8 5.9  NEUTROABS 3.6  --  3.7 3.8  --   --   --   --   --   --   HGB 3.9*  < > 5.6* 6.8*  < > 7.6* 8.1* 7.8* 7.8* 7.1*  HCT 12.8*  < > 17.0* 20.7*  < > 22.6* 24.6* 24.3* 24.5* 22.1*  MCV 90.8  --  88.1 87.7  < > 86.3 86.6 86.2 87.2 87.7  PLT 91*  --  84* 78*  < > 86* 84* 87* 94* 85*  < > = values in this interval not displayed. No results for input(s): CKTOTAL, CKMB, CKMBINDEX, TROPONINI in the last 168 hours. No results for input(s): LABPROT, INR in the last 72 hours.  Recent Labs  03-03-14 2031  COLORURINE YELLOW  LABSPEC 1.019  PHURINE 5.5  GLUCOSEU NEGATIVE  HGBUR LARGE*  BILIRUBINUR NEGATIVE  KETONESUR NEGATIVE  PROTEINUR NEGATIVE  UROBILINOGEN 1.0  NITRITE NEGATIVE  LEUKOCYTESUR SMALL*    No results found for: CHOL, TRIG, HDL, CHOLHDL, VLDL, LDLCALC No results found for: HGBA1C    Component Value Date/Time   LABOPIA POSITIVE* 06/22/2014 1355   COCAINSCRNUR NONE DETECTED 07/02/2014 1355  LABBENZ NONE DETECTED 06/12/2014 1355   AMPHETMU NONE DETECTED 06/08/2014 1355   THCU NONE DETECTED 06/17/2014 1355   LABBARB NONE DETECTED 06/10/2014 1355    No results for input(s): ETH in the last 168 hours.  Dg Chest 2 View 07/02/2014    No acute disease.    Ct Head Wo Contrast 07/02/2014    Extensive hemorrhage which appears to arise from the left centrum semiovale with extensive intraventricular extension of hemorrhage. There is evidence of hydrocephalus. There is extensive supratentorial small vessel disease. No midline shift.    [place] Renal 07/02/2014    1. Marked left hydronephrosis and probable dilatation of the proximal left ureter.  2. Suspect stones and possible urothelial lesion within the bladder.  Recommend CT of the abdomen and pelvis without and with contrast for further evaluation.  3. Ascites.  4. Gallbladder sludge.     Dg Chest Port 1 View 07/02/2014    Endotracheal tube as described without pneumothorax. No edema or  consolidation.        PHYSICAL EXAM Elderly african [place] male intubated. Afebrile. Head is nontraumatic. Neck is supple without bruit. . Cardiac exam RRR no murmur or gallop. Lungs with rhonchi to auscultation.  Neurological Exam : intubated unresponsive. Eyes closed no response to sternal rub. Pupils 2 mm sluggishly reactive. Dysconjugate gaze with right eye exotropia. Fundi not visualized. Right lower face weak but difficult to visualize due to vent intubation. Right hemiplegia. Withdraws left side minimally to pain. Posturing right arm to pain. No response to pain right leg. Sustained clonus right ankle jerk. Left plantar downgoing and right upgoing.    ASSESSMENT/PLAN Mr. Corey Glenn is a 58 y.o. male with history of left lower extremity DVT with IV heparin, tobacco use, alcohol use, and chronic lower extremity pain, presenting with altered mental status and inability to respond.  He did not receive IV t-PA due to a hemorrhagic stroke.  Stroke: Dominant - Extensive hemorrhage which appears to arise from the left centrum semiovale with extensive intraventricular extension of hemorrhage.   Resultant  Comatose state with right hemiplegia  MRI  not ordered  MRA  not ordered  Carotid Doppler  not ordered  2D Echo  not ordered  LDL not ordered  HgbA1c not ordered  VTE prophylaxis - none at this time. Known left lower extremity DVT. 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  Recent 30 pound weight loss.-  Malnutrition  Other Pertinent History  Aspirin allergy  DNR  Hospital day # 5   [facility] Pager [phone] 07/04/2014, 8:47 AM   Personally examined patient and images, agree with history, physical, neuro exam as stated above. Agree with assessment and plan.   Naomie DeanAntonia Chailyn Racette, MD [facility]    To contact Stroke Continuity provider, please refer to [email or web address]. After hours, contact General Neurology

## 2014-07-05 DIAGNOSIS — Z515 Encounter for palliative care: Secondary | ICD-10-CM

## 2014-07-05 DIAGNOSIS — R40243 Glasgow coma scale score 3-8: Secondary | ICD-10-CM

## 2014-07-05 DIAGNOSIS — E43 Unspecified severe protein-calorie malnutrition: Secondary | ICD-10-CM

## 2014-07-05 DIAGNOSIS — Z720 Tobacco use: Secondary | ICD-10-CM

## 2014-07-05 DIAGNOSIS — Z66 Do not resuscitate: Secondary | ICD-10-CM

## 2014-07-05 LAB — CULTURE, RESPIRATORY W GRAM STAIN

## 2014-07-05 LAB — CULTURE, RESPIRATORY

## 2014-07-05 MED ORDER — MORPHINE BOLUS VIA INFUSION
5.0000 mg | INTRAVENOUS | Status: DC | PRN
  Administered 2014-07-07: 12.5 mg via INTRAVENOUS
  Administered 2014-07-07: 10 mg via INTRAVENOUS
  Administered 2014-07-07: 5 mg via INTRAVENOUS
  Filled 2014-07-05 (×4): qty 20

## 2014-07-05 MED ORDER — LORAZEPAM BOLUS VIA INFUSION
2.0000 mg | INTRAVENOUS | Status: DC | PRN
  Administered 2014-07-07: 2 mg via INTRAVENOUS
  Administered 2014-07-07: 1.6 mg via INTRAVENOUS
  Administered 2014-07-07: 1 mg via INTRAVENOUS
  Administered 2014-07-07: 1.3 mg via INTRAVENOUS
  Filled 2014-07-05 (×5): qty 5

## 2014-07-05 MED ORDER — LORAZEPAM 2 MG/ML IJ SOLN
5.0000 mg/h | INTRAMUSCULAR | Status: DC
  Administered 2014-07-05: 0.25 mg/h via INTRAVENOUS
  Administered 2014-07-07: 1 mg/h via INTRAVENOUS
  Filled 2014-07-05 (×2): qty 25

## 2014-07-05 MED ORDER — MORPHINE SULFATE 10 MG/ML IJ SOLN
10.0000 mg/h | INTRAVENOUS | Status: DC
  Administered 2014-07-05 – 2014-07-06 (×2): 5 mg/h via INTRAVENOUS
  Administered 2014-07-06 – 2014-07-07 (×2): 10 mg/h via INTRAVENOUS
  Administered 2014-07-07: 15.6 mg/h via INTRAVENOUS
  Filled 2014-07-05 (×5): qty 10

## 2014-07-05 NOTE — Progress Notes (Signed)
   07/05/14 1615  Clinical Encounter Type  Visited With Family  Visit Type Spiritual support;Other (Comment) (Withdrawing life support)  Referral From Nurse  Consult/Referral To Chaplain  Chaplain responded to page from nurse that life support to be withdrawn at 2 pm. Chaplain spoke briefly with some family at bedside, facilitated story-telling. Family's pastor is here, and family is well-supported. Page if needed.  Wille GlaserMcCray, Kade Demicco O, Chaplain 07/05/2014 4:16 PM

## 2014-07-05 NOTE — Progress Notes (Signed)
STROKE TEAM PROGRESS NOTE   HISTORY Corey Glenn is a 58 y.o. male with chronic foot pain, admitted with left LE DVT who has been on IV heparin and doing well until around 6 pm today when his nurse found him with altered mental status, unable to respond to verbal or painful commands. Evaluation by internal medicine resident revealed a dysconjugate gaze and neuro was asked to see patient for further evaluation. IV heparin was stopped promptly. Was given protamine for reversal. At this moment, patient is unresponsive to painful stimuli.  Date last known well: 07/02/14 Time last known well: 3 pm tPA Given: no, IV heparin  .   SUBJECTIVE (INTERVAL HISTORY): Per note, plan for full withdrawal and comfort measures when family ready 07/05/14  OBJECTIVE Temp:  [98.7 F (37.1 C)-101.8 F (38.8 C)] 101.8 F (38.8 C) (11/29 0745) Pulse Rate:  [74-91] 88 (11/29 0900) Cardiac Rhythm:  [-] Normal sinus rhythm (11/29 0800) Resp:  [15-16] 15 (11/29 0900) BP: (119-156)/(68-90) 149/88 mmHg (11/29 0900) SpO2:  [98 %-100 %] 100 % (11/29 0900) FiO2 (%):  [30 %-40 %] 30 % (11/29 0800)   Recent Labs Lab 07/02/14 1205 07/02/14 1252 07/02/14 1755  GLUCAP 65* 94 92    Recent Labs Lab 2013/11/15 0930 06/18/2014 0741 07/02/14 0045 06/19/2014 0212  NA 139 139 138 142  K 3.3* 3.6* 3.5* 3.7  CL 103 102 101 103  CO2  --  25 23 22   GLUCOSE 106* 84 83 99  BUN 24* 24* 21 20  CREATININE 1.70* 1.34 1.31 1.23  CALCIUM  --  10.9* 10.8*  10.2 10.4  MG  --   --  1.3*  --   PHOS  --   --  2.8  --     Recent Labs Lab 06/23/2014 0346 06/09/2014 0212  AST 5 <5  ALT <5 <5  ALKPHOS 89 88  BILITOT 1.1 0.7  PROT 5.9* 5.5*  ALBUMIN 2.2* 2.0*    Recent Labs Lab 2013/11/15 0921  2013/11/15 2322 06/30/14 0705  06/19/2014 0346 07/02/14 0045 07/02/14 0840 06/25/2014 0212 07/04/14 1000  WBC 5.1  --  5.4 5.7  < > 6.3 5.9 5.8 5.9 5.1  NEUTROABS 3.6  --  3.7 3.8  --   --   --   --   --   --   HGB 3.9*  < > 5.6*  6.8*  < > 8.1* 7.8* 7.8* 7.1* 6.5*  HCT 12.8*  < > 17.0* 20.7*  < > 24.6* 24.3* 24.5* 22.1* 19.3*  MCV 90.8  --  88.1 87.7  < > 86.6 86.2 87.2 87.7 90.2  PLT 91*  --  84* 78*  < > 84* 87* 94* 85* 94*  < > = values in this interval not displayed. No results for input(s): CKTOTAL, CKMB, CKMBINDEX, TROPONINI in the last 168 hours. No results for input(s): LABPROT, INR in the last 72 hours. No results for input(s): COLORURINE, LABSPEC, PHURINE, GLUCOSEU, HGBUR, BILIRUBINUR, KETONESUR, PROTEINUR, UROBILINOGEN, NITRITE, LEUKOCYTESUR in the last 72 hours.  Invalid input(s): APPERANCEUR  No results found for: CHOL, TRIG, HDL, CHOLHDL, VLDL, LDLCALC No results found for: HGBA1C    Component Value Date/Time   LABOPIA POSITIVE* 2014/01/05 1355   COCAINSCRNUR NONE DETECTED 2014/01/05 1355   LABBENZ NONE DETECTED 2014/01/05 1355   AMPHETMU NONE DETECTED 2014/01/05 1355   THCU NONE DETECTED 2014/01/05 1355   LABBARB NONE DETECTED 2014/01/05 1355    No results for input(s): ETH in the last 168 hours.  Dg Chest 2 View 07/02/2014    No acute disease.    Ct Head Wo Contrast 07/02/2014    Extensive hemorrhage which appears to arise from the left centrum semiovale with extensive intraventricular extension of hemorrhage. There is evidence of hydrocephalus. There is extensive supratentorial small vessel disease. No midline shift.    [place] Renal 07/02/2014    1. Marked left hydronephrosis and probable dilatation of the proximal left ureter.  2. Suspect stones and possible urothelial lesion within the bladder.  Recommend CT of the abdomen and pelvis without and with contrast for further evaluation.  3. Ascites.  4. Gallbladder sludge.     Dg Chest Port 1 View 07/02/2014    Endotracheal tube as described without pneumothorax. No edema or consolidation.        PHYSICAL EXAM Elderly african [place] male intubated. Afebrile. Head is nontraumatic. Neck is supple without bruit. . Cardiac exam RRR no  murmur or gallop. Lungs with rhonchi to auscultation.  Neurological Exam : intubated unresponsive. Eyes closed no response to sternal rub. Pupils 2 mm sluggishly reactive. Dysconjugate gaze with right eye exotropia. Fundi not visualized. Right lower face weak but difficult to visualize due to vent intubation. Right hemiplegia. Withdraws left side minimally to pain. Posturing right arm to pain. No response to pain right leg. Sustained clonus right ankle jerk. Left plantar downgoing and right upgoing. Myoclonus with stimulation.   ASSESSMENT/PLAN Mr. Corey Glenn is a 58 y.o. male with history of left lower extremity DVT with IV heparin, tobacco use, alcohol use, and chronic lower extremity pain, presenting with altered mental status and inability to respond.  He did not receive IV t-PA due to a hemorrhagic stroke.  Stroke: Dominant - Extensive hemorrhage which appears to arise from the left centrum semiovale with extensive intraventricular extension of hemorrhage.   Resultant  Comatose state with right hemiplegia  MRI  not ordered  MRA  not ordered  Carotid Doppler  not ordered  2D Echo  not ordered  LDL not ordered  HgbA1c not ordered  VTE prophylaxis - none at this time. Known left lower extremity DVT. 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   NPO no liquids  No antithrombotics prior to admission, now on no antithrombotic or anticoagulants secondary to hemorrhage.  Ongoing aggressive stroke risk factor management  Therapy recommendations:  Pending  Disposition:  Pending    Other Stroke Risk Factors  Advanced age  Cigarette smoker, advised to stop smoking  ETOH abuse   Other Active Problems  Severe anemia / thrombocytopenia - hemoglobin 3.9 on admission.( 6.5 on 07/04/2014 ) repeat in a.m. Monday.  Suspected occult malignancy - work up had been in progress.  Recent 30 pound weight loss.- Malnutrition  Other Pertinent History  Aspirin allergy  DNR  Left lower  extremity DVT   plan for full withdrawal and comfort measures when family ready 07/05/14. Stroke team will sign off at this time. Please contact us if we can be of further service.   Hospital day # 6   [facility] Pager [phone] 07/05/2014, 9:11 AM   Personally examined patient and images, agree with history, physical, neuro exam as stated above. Agree with assessment and plan.   Naomie DeanAntonia Cindy Fullman, MD [facility]      To contact Stroke Continuity provider, please refer to [email or web address]. After hours, contact General Neurology

## 2014-07-05 NOTE — Plan of Care (Signed)
Problem: Phase I Progression Outcomes Goal: Pain controlled with appropriate interventions Outcome: Progressing Goal: Non-pain symptoms managed Outcome: Progressing Goal: Pharmacy Consult if indicated Outcome: Progressing Goal: Oral assessment and care per protocol Outcome: Progressing Goal: Voiding-avoid urinary catheter unless indicated Outcome: Not Progressing Goal: Swallow eval if indicated Outcome: Not Applicable Date Met:  71/85/50 Goal: Respirations unlabored Outcome: Progressing Goal: Psychosocial & spiritual needs assessed Outcome: Progressing Goal: Goals of care identified Outcome: Progressing

## 2014-07-05 NOTE — Progress Notes (Signed)
PULMONARY / CRITICAL CARE MEDICINE   Name: Corey Glenn ServeFlacks MRN: [id number] DOB: [birth year]    ADMISSION DATE:  06/13/2014 CONSULTATION DATE:  07/02/2014  REFERRING MD :  Heide SparkNarendra  CHIEF COMPLAINT:  AMS  INITIAL PRESENTATION: 58 year old male with no known medical history presented 11/23 with LLE pain. Found to have DVT. Admitted to hospitalist service. Also noted to be anemic with unexpected 15lb weight loss. He was noted to be unresponsive on medical floor 11/26. He was intubated and transferred to ICU. CT head showed large ICH. PCCM to assume care.   STUDIES:  11/26 CT head > Extensive hemorrhage with extensive intraventricular extension of hemorrhage.There is evidence of hydrocephalus. No midline shift. 11/26 Renal US > Marked left hydronephrosis and probable dilatation of the proximal left ureter. Suspect stones and possible urothelial lesion within the bladder. Recommend CT of the abdomen and pelvis without and with contrast for further evaluation. Ascites. Gallbladder sludge.  SIGNIFICANT EVENTS: 11/23 admitted for DVT 11/26 ICH, to ICU on vent., givenprotamine   SUBJECTIVE:  remains comatose afebrile  VITAL SIGNS: Temp:  [98.7 F (37.1 C)-101.8 F (38.8 C)] 101.8 F (38.8 C) (11/29 0745) Pulse Rate:  [74-91] 91 (11/29 0736) Resp:  [15-16] 15 (11/29 0736) BP: (119-156)/(68-90) 144/82 mmHg (11/29 0736) SpO2:  [98 %-100 %] 100 % (11/29 0736) FiO2 (%):  [30 %-40 %] 30 % (11/29 0736) HEMODYNAMICS:   VENTILATOR SETTINGS: Vent Mode:  [-] PRVC FiO2 (%):  [30 %-40 %] 30 % Set Rate:  [15 bmp] 15 bmp Vt Set:  [600 mL] 600 mL PEEP:  [5 cmH20] 5 cmH20 Plateau Pressure:  [14 cmH20-16 cmH20] 14 cmH20 INTAKE / OUTPUT:  Intake/Output Summary (Last 24 hours) at 07/05/14 0844 Last data filed at 07/05/14 0600  Gross per 24 hour  Intake    300 ml  Output    970 ml  Net   -670 ml    PHYSICAL EXAMINATION: General:  Cachectic male, comatose.  Neuro: comatose on vent. Has  myoclonus to stimulation  HEENT:  Poor dentition, malodorous oral secretions. [place]/AT, No JVD noted Cardiovascular:  RRR, no MRG noted Lungs:  Diffuse rhonchi Abdomen:  Soft, non-distended Musculoskeletal:  No acute deformity Skin:  LLE Edema, otherwise intact  LABS:  CBC  Recent Labs Lab 07/02/14 0840 23-Jul-2014 0212 07/04/14 1000  WBC 5.8 5.9 5.1  HGB 7.8* 7.1* 6.5*  HCT 24.5* 22.1* 19.3*  PLT 94* 85* 94*    BMET  Recent Labs Lab 07/01/14 0741 07/02/14 0045 23-Jul-2014 0212  NA 139 138 142  K 3.6* 3.5* 3.7  CL 102 101 103  CO2 25 23 22   BUN 24* 21 20  CREATININE 1.34 1.31 1.23  GLUCOSE 84 83 99    Imaging No results found.   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

## 2014-07-05 NOTE — Procedures (Signed)
Extubation Procedure Note  Patient Details:   Name: Corey Glenn DOB: 1956/05/23 MRN: [id number]   Airway Documentation:     Evaluation  O2 sats: currently acceptable Complications: No apparent complications Patient did tolerate procedure well. Bilateral Breath Sounds: Clear Suctioning: Airway No   Pt extubated using withdrawal of life orders.   Ok AnisKelly Smith, MA 07/05/2014, 4:22 PM

## 2014-07-05 NOTE — Progress Notes (Signed)
Patient ID: Corey Glenn, male   DOB: [birth year], 58 y.o.   MRN: [id number] Medicine attending: Per critical care attending note, family has decided to withdraw ventilatory support. He will be started on a terminal wean protocol. Critical care assistance greatly appreciated. Temperature elevation to 101.8 this morning. In view of the above, we will not initiate antibiotic therapy.

## 2014-07-06 DIAGNOSIS — G9340 Encephalopathy, unspecified: Secondary | ICD-10-CM

## 2014-07-06 LAB — PROTEIN ELECTROPHORESIS, SERUM
Albumin ELP: 45.3 % — ABNORMAL LOW (ref 55.8–66.1)
Alpha-1-Globulin: 12.2 % — ABNORMAL HIGH (ref 2.9–4.9)
Alpha-2-Globulin: 14.7 % — ABNORMAL HIGH (ref 7.1–11.8)
BETA GLOBULIN: 5.8 % (ref 4.7–7.2)
Beta 2: 7.9 % — ABNORMAL HIGH (ref 3.2–6.5)
Gamma Globulin: 14.1 % (ref 11.1–18.8)
M-Spike, %: NOT DETECTED g/dL
TOTAL PROTEIN ELP: 5.9 g/dL — AB (ref 6.0–8.3)

## 2014-07-06 NOTE — Progress Notes (Signed)
PULMONARY / CRITICAL CARE MEDICINE   Name: Corey Glenn MRN: [id number] DOB: [birth year]    ADMISSION DATE:  Dec 23, 2013 CONSULTATION DATE:  07/02/2014  REFERRING MD :  Corey Glenn  CHIEF COMPLAINT:  AMS  INITIAL PRESENTATION: 58 year old male with no known medical history presented 11/23 with LLE pain. Found to have DVT. Admitted to hospitalist service. Also noted to be anemic with unexpected 15lb weight loss. He was noted to be unresponsive on medical floor 11/26. He was intubated and transferred to ICU. CT head showed large ICH. PCCM to assume care.   STUDIES:  11/26 CT head > Extensive hemorrhage with extensive intraventricular extension of hemorrhage.There is evidence of hydrocephalus. No midline shift. 11/26 Renal US > Marked left hydronephrosis and probable dilatation of the proximal left ureter. Suspect stones and possible urothelial lesion within the bladder. Recommend CT of the abdomen and pelvis without and with contrast for further evaluation. Ascites. Gallbladder sludge.  SIGNIFICANT EVENTS: 11/23 admitted for DVT 11/26 ICH, to ICU on vent., givenprotamine  SUBJECTIVE:  Comatose, morphine and ativan  Drips required  VITAL SIGNS: Temp:  [100.5 F (38.1 C)] 100.5 F (38.1 C) (11/29 1126) Pulse Rate:  [74-92] 83 (11/30 0700) Resp:  [15-31] 22 (11/30 0700) BP: (118-149)/(50-85) 120/61 mmHg (11/30 0411) SpO2:  [97 %-100 %] 97 % (11/30 0700) FiO2 (%):  [30 %] 30 % (11/29 1600) HEMODYNAMICS:   VENTILATOR SETTINGS: Vent Mode:  [-] PRVC FiO2 (%):  [30 %] 30 % Set Rate:  [15 bmp] 15 bmp Vt Set:  [600 mL] 600 mL PEEP:  [5 cmH20] 5 cmH20 Plateau Pressure:  [14 cmH20] 14 cmH20 INTAKE / OUTPUT:  Intake/Output Summary (Last 24 hours) at 07/06/14 0906 Last data filed at 07/06/14 0700  Gross per 24 hour  Intake 111.09 ml  Output    690 ml  Net -578.91 ml    PHYSICAL EXAMINATION: General:  Cachectic male, comatose. Some increase RR noted on morphine Neuro: comatose on  vent. No myoclonus HEENT:  Poor dentition Cardiovascular:  RRR, no MRG noted Lungs:  coarse Abdomen:  Soft, non-distended Musculoskeletal:  No acute deformity Skin:  LLE Edema, otherwise intact  LABS:  CBC  Recent Labs Lab 07/02/14 0840 06/15/2014 0212 07/04/14 1000  WBC 5.8 5.9 5.1  HGB 7.8* 7.1* 6.5*  HCT 24.5* 22.1* 19.3*  PLT 94* 85* 94*    BMET  Recent Labs Lab 06/30/2014 0741 07/02/14 0045 06/30/2014 0212  NA 139 138 142  K 3.6* 3.5* 3.7  CL 102 101 103  CO2 25 23 22   BUN 24* 21 20  CREATININE 1.34 1.31 1.23  GLUCOSE 84 83 99    Imaging No results found.   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 Tyson [name], MD, FACP Pgr: [phone] [facility]

## 2014-07-06 NOTE — Plan of Care (Signed)
Problem: Consults Goal: Intracranial Hemorrhage Patient Education See Patient Education Module for education specifics. Outcome: Completed/Met Date Met:  07/06/14 Goal: Skin Care Protocol Initiated - if Braden Score 18 or less If consults are not indicated, leave blank or document N/A Outcome: Completed/Met Date Met:  07/06/14 Goal: Nutrition Consult-if indicated Outcome: Completed/Met Date Met:  07/06/14 Goal: Diabetes Guidelines if Diabetic/Glucose > 140 If diabetic or lab glucose is > 140 mg/dl - Initiate Diabetes/Hyperglycemia Guidelines & Document Interventions  Outcome: Not Applicable Date Met:  35/43/01  Problem: Acute Treatment Outcomes Goal: Neuro exam at baseline or improved Outcome: Not Met (add Reason) Goal: BP within ordered parameters Outcome: Completed/Met Date Met:  07/06/14 Goal: Coagulation normalized Outcome: Not Met (add Reason) Goal: Airway maintained/protected Outcome: Not Met (add Reason) Goal: 02 Sats > 94% Outcome: Not Met (add Reason) Goal: Pain controlled Outcome: Completed/Met Date Met:  07/06/14 Goal: Nausea and vomiting controlled Outcome: Completed/Met Date Met:  07/06/14 Goal: Prognosis discussed with family/patient as appropriate Outcome: Completed/Met Date Met:  07/06/14 Goal: Other Acute Treatment Outcomes Outcome: Completed/Met Date Met:  07/06/14

## 2014-07-06 NOTE — Progress Notes (Signed)
Events and decisions of weekend noted. Please let me know if I can help in any way.

## 2014-07-06 NOTE — Progress Notes (Signed)
Medicine attending: Weekend events noted. Pt with large ICH with intraventricular extension while on heparin gtt for occlusive DVT. Family decided to forego ventriculostomy. Pt is now extubated on morphine gtt and ativan. Critical care assistance appreciated

## 2014-07-07 DIAGNOSIS — I614 Nontraumatic intracerebral hemorrhage in cerebellum: Secondary | ICD-10-CM

## 2014-07-07 DEATH — deceased

## 2014-07-09 ENCOUNTER — Ambulatory Visit: Payer: Self-pay | Admitting: Internal Medicine

## 2014-07-10 NOTE — Discharge Summary (Signed)
NAMEANUSH, WIEDEMAN               ACCOUNT NO.:  [id number]  MEDICAL RECORD NO.:  [id number]  LOCATION:  [id number]                        FACILITY:  [facility]  PHYSICIAN:  Providence Lanius, MD  DATE OF BIRTH:  [birth year]  DATE OF ADMISSION:  06/12/2014 DATE OF DISCHARGE:  August 02, 2014                              DISCHARGE SUMMARY   DEATH SUMMARY  PRIMARY DIAGNOSIS/CAUSE OF DEATH:  Large intracranial hemorrhage.  SECONDARY DIAGNOSES: 1. Acute respiratory failure secondary to inability to protect his     airway. 2. Left lower extremity deep venous thrombosis. 3. Anemia. 4. Thrombocytopenia. 5. Acute kidney injury. 6. Hydronephrosis. 7. Hypercalcemia. 8. Aspiration pneumonitis. 9. Hypertension.  HOSPITAL COURSE:  The patient is a 58 year old male with an extensive past medical history who presented to the hospital with intracranial hemorrhage in the medical floor on July 02, 2014.  The patient was transferred to ICU.  CT scan showed massive intracranial hemorrhage. PCCM assumed care on November 30th.  The patient was terminally extubated after Dr. Titus Mould met with the family at which point the patient was made a full DNR comfort care.  Morphine was started.  The patient was transferred to the floor and expired shortly thereafter.     Providence Lanius, MD     WJY/MEDQ  D:  07/09/2014  T:  07/10/2014  Job:  [id number]

## 2014-08-07 NOTE — Progress Notes (Signed)
Ativan drip 35 ml and Morphine drip 40ml wasted witnessed by Leodis Binetaylor Cook, RN

## 2014-08-07 NOTE — Progress Notes (Signed)
eLink Physician-Brief Progress Note Patient Name: Corey Glenn DOB: [birth year] MRN: [id number]   Date of Service  07/08/2014  HPI/Events of Note  Patient comfort care, notified by RN time of death 18:30 on July 07, 2014  eICU Interventions  Above noted.         Tori Cupps 07/11/2014, 6:36 PM

## 2014-08-07 NOTE — Progress Notes (Signed)
Noted patient stop breathing and pulse not appreciated, no BP noted  at 1830, another RN verified. MD on call made aware. Patient was already referred to [employer] [facility] with referral number [phone] [id number] and body not suitable for organ donation by Wyona Almasianne Smith. Family made aware about the death.

## 2014-08-07 NOTE — Progress Notes (Signed)
PULMONARY / CRITICAL CARE MEDICINE   Name: Corey Glenn MRN: [id number] DOB: [birth year]    ADMISSION DATE:  06/14/2014 CONSULTATION DATE:  07/02/2014  REFERRING MD :  Heide SparkNarendra  CHIEF COMPLAINT:  AMS  INITIAL PRESENTATION: 59 year old male with no known medical history presented 11/23 with LLE pain. Found to have DVT. Admitted to hospitalist service. Also noted to be anemic with unexpected 15lb weight loss. He was noted to be unresponsive on medical floor 11/26. He was intubated and transferred to ICU. CT head showed large ICH. PCCM to assume care.   STUDIES:  11/26 CT head > Extensive hemorrhage with extensive intraventricular extension of hemorrhage.There is evidence of hydrocephalus. No midline shift. 11/26 Renal US > Marked left hydronephrosis and probable dilatation of the proximal left ureter. Suspect stones and possible urothelial lesion within the bladder. Recommend CT of the abdomen and pelvis without and with contrast for further evaluation. Ascites. Gallbladder sludge.  SIGNIFICANT EVENTS: 11/23 admitted for DVT 11/26 ICH, to ICU on vent., given protamine 11/30 terminal extubation, to floor for comfort measures.  SUBJECTIVE:  Family and RN voice no complaints regarding patients comfort.  VITAL SIGNS: Temp:  [98.7 F (37.1 C)] 98.7 F (37.1 C) (11/30 1316) Pulse Rate:  [86-140] 140 (12/01 0603) Resp:  [24] 24 (11/30 1316) BP: (104-158)/(56-78) 104/56 mmHg (12/01 0603) SpO2:  [46 %-88 %] 46 % (12/01 0603) HEMODYNAMICS:   VENTILATOR SETTINGS:   INTAKE / OUTPUT:  Intake/Output Summary (Last 24 hours) at 08/05/2014 1131 Last data filed at 07/30/2014 [id number]  Gross per 24 hour  Intake     11 ml  Output   1000 ml  Net   -989 ml    PHYSICAL EXAMINATION: General:  Cachectic male, comatose. Neuro: comatose on vent. No myoclonus HEENT:  Poor dentition Cardiovascular:  RRR, no MRG noted Lungs:  Sonorous respirations, difficult to auscultate lung fields in this setting.   Abdomen:  Soft, non-distended Musculoskeletal:  No acute deformity Skin:  LLE Edema, otherwise intact  LABS:  CBC  Recent Labs Lab 07/02/14 0840 05-Sep-2013 0212 07/04/14 1000  WBC 5.8 5.9 5.1  HGB 7.8* 7.1* 6.5*  HCT 24.5* 22.1* 19.3*  PLT 94* 85* 94*    BMET  Recent Labs Lab 06/12/2014 0741 07/02/14 0045 05-Sep-2013 0212  NA 139 138 142  K 3.6* 3.5* 3.7  CL 102 101 103  CO2 25 23 22   BUN 24* 21 20  CREATININE 1.34 1.31 1.23  GLUCOSE 84 83 99    Imaging No results found.   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   Joneen RoachPaul Hoffman, ACNP [facility] Pager [phone] or [phone]  Continue comfort measures.  IMTS to pick up in AM and PCCM will sign off.  ? Transfer to inpatient hospice.  Patient seen and examined, agree with above note.  I dictated the care and orders written for this patient under my direction.  Alyson ReedyWesam G Yacoub, MD [phone]

## 2014-08-07 NOTE — Progress Notes (Signed)
Nutrition Brief Note  Chart reviewed. Pt has now transitioned to comfort care.  No further nutrition interventions warranted at this time. Please re-consult as needed.   Emmaline KluverHaley Morrill Bomkamp MS, RD, LDN

## 2014-08-07 DEATH — deceased

## 2016-08-02 IMAGING — CT CT HEAD W/O CM
1 series · 12 of 14 positions shown, 15 images · non-contrast
Comparison: None.

ADDENDUM:
Multiple attempts to communicate this critical value directly to Dr.
Fc have been unsuccessful. By report from the floor nurse, Dr.
Fc viewed the study directly and arranged for transfer to the
Neurosurgery service.
CLINICAL DATA: Patient unresponsive ; patient on blood thinners

EXAM:
CT HEAD WITHOUT CONTRAST
TECHNIQUE: Contiguous axial images were obtained from the base of the skull
through the vertex without intravenous contrast.

[Series 3: head w/o 5.0 h30s · axial · non-contrast · 0.47mm/px · z∈[-189,-59]mm · 12 of 32 slices shown, 15 images]
[im 3/32  soft-tissue]
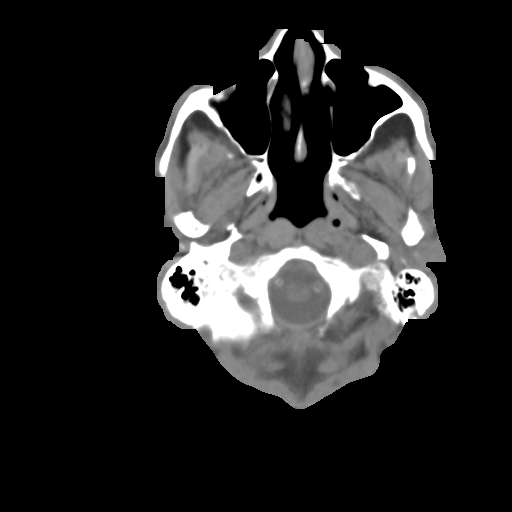
[im 3/32  bone]
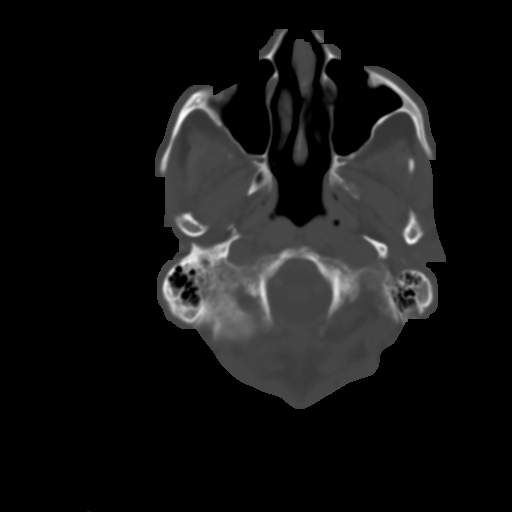
[im 5/32  bone]
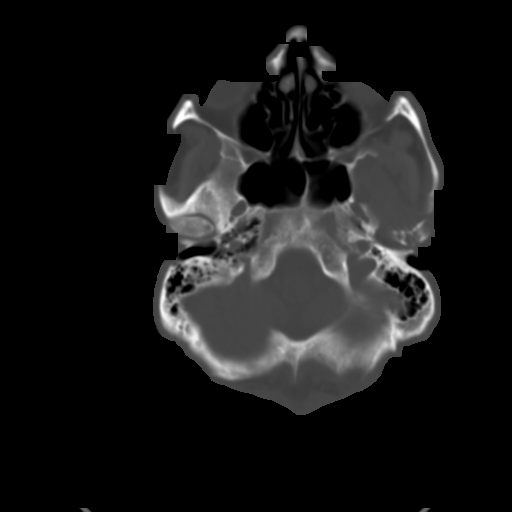
[im 8/32  bone]
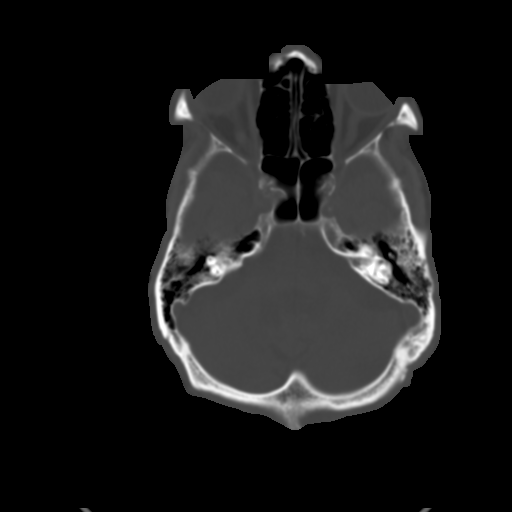
[im 10/32  bone]
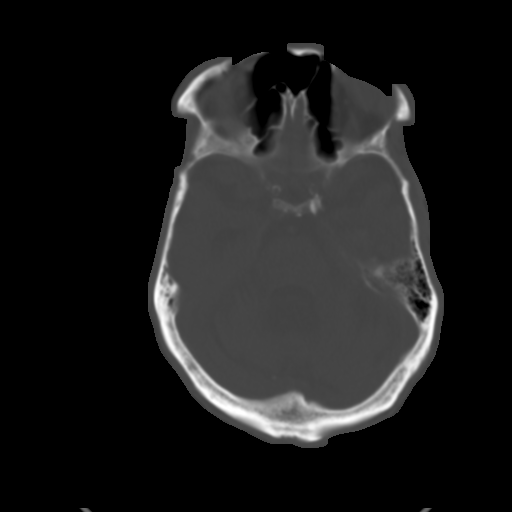
[im 12/32  soft-tissue]
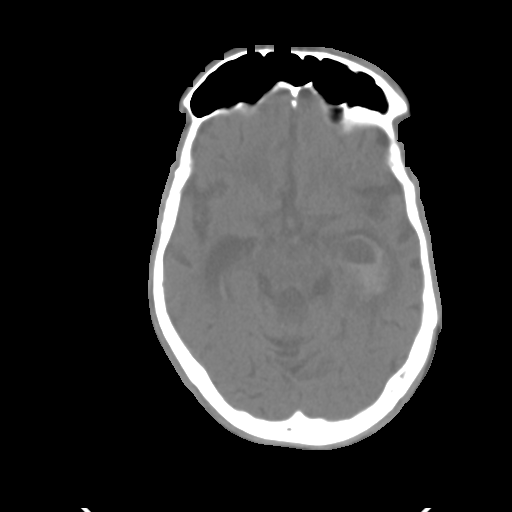
[im 12/32  bone]
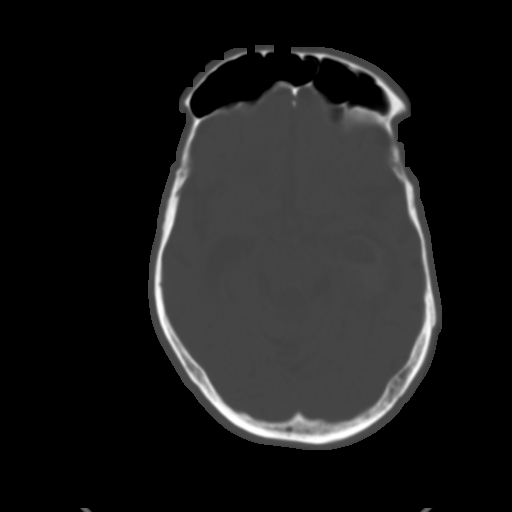
[im 15/32  bone]
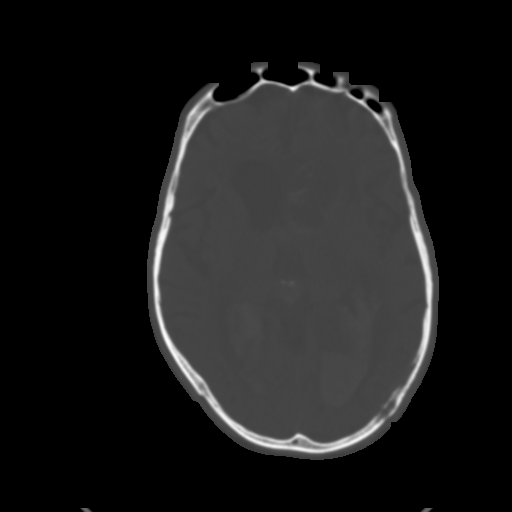
[im 17/32  bone]
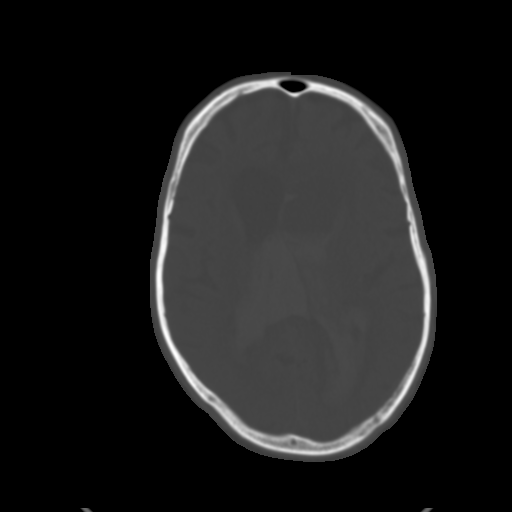
[im 20/32  bone]
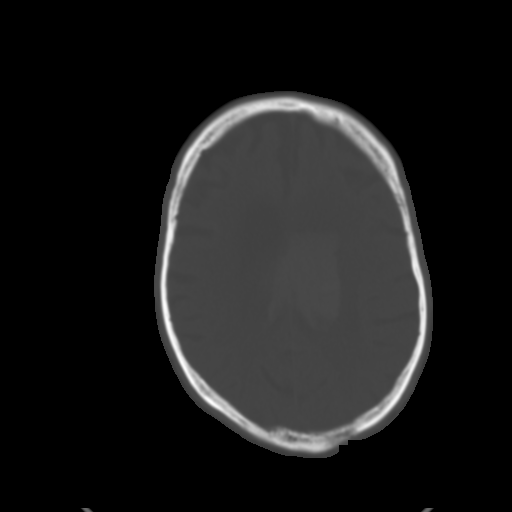
[im 22/32  soft-tissue]
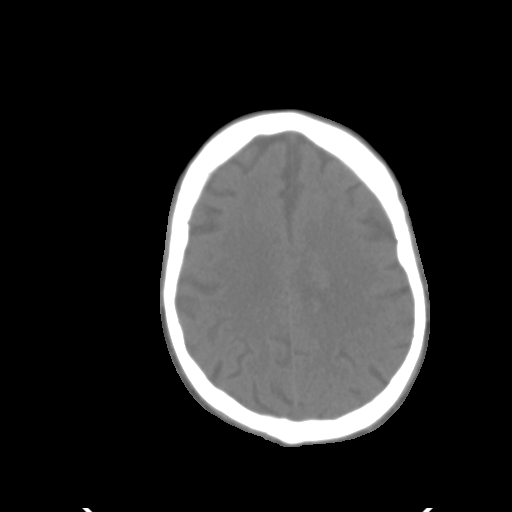
[im 22/32  bone]
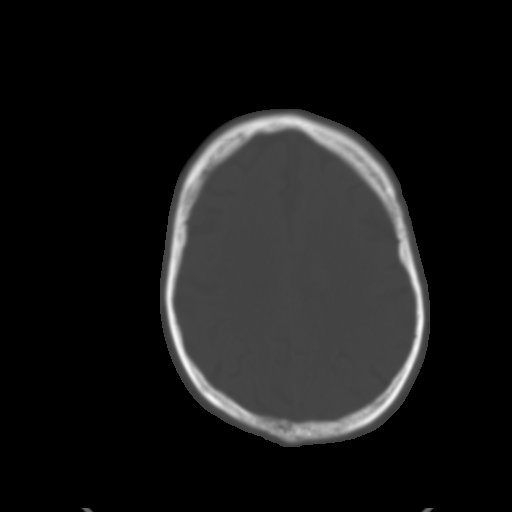
[im 24/32  bone]
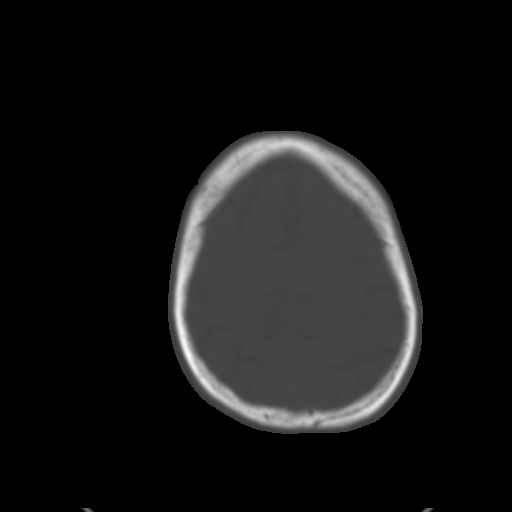
[im 27/32  bone]
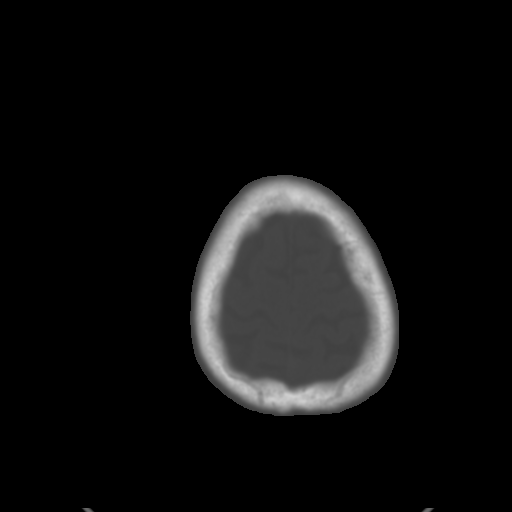
[im 29/32  bone]
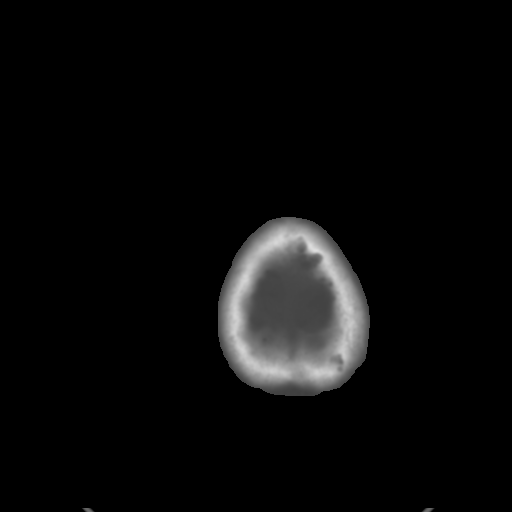

[12 of 14 positions shown; findings below may reference images not displayed]

FINDINGS: There is hemorrhage which appears to arise from the left centrum
semiovale with extensive intraventricular extension. Hemorrhage is
seen throughout much of both lateral ventricles as well as
throughout portions of the third ventricle. There is moderate
generalized ventricular enlargement. There is no midline shift.
There is no subdural or epidural fluid. There is extensive small
vessel disease in the centra semiovale bilaterally. The bony
calvarium appears intact. The mastoid air cells are clear.
IMPRESSION: Extensive hemorrhage which appears to arise from the left centrum
semiovale with extensive intraventricular extension of hemorrhage.
There is evidence of hydrocephalus. There is extensive
supratentorial small vessel disease. No midline shift.

## 2016-08-02 IMAGING — DX DG CHEST 2V
2 series · 2 of 2 positions shown · non-contrast
Comparison: None.

CLINICAL DATA: DVT in the left lower extremity.  Anemia.  Smoker.

EXAM:
CHEST  2 VIEW

[chest lat]
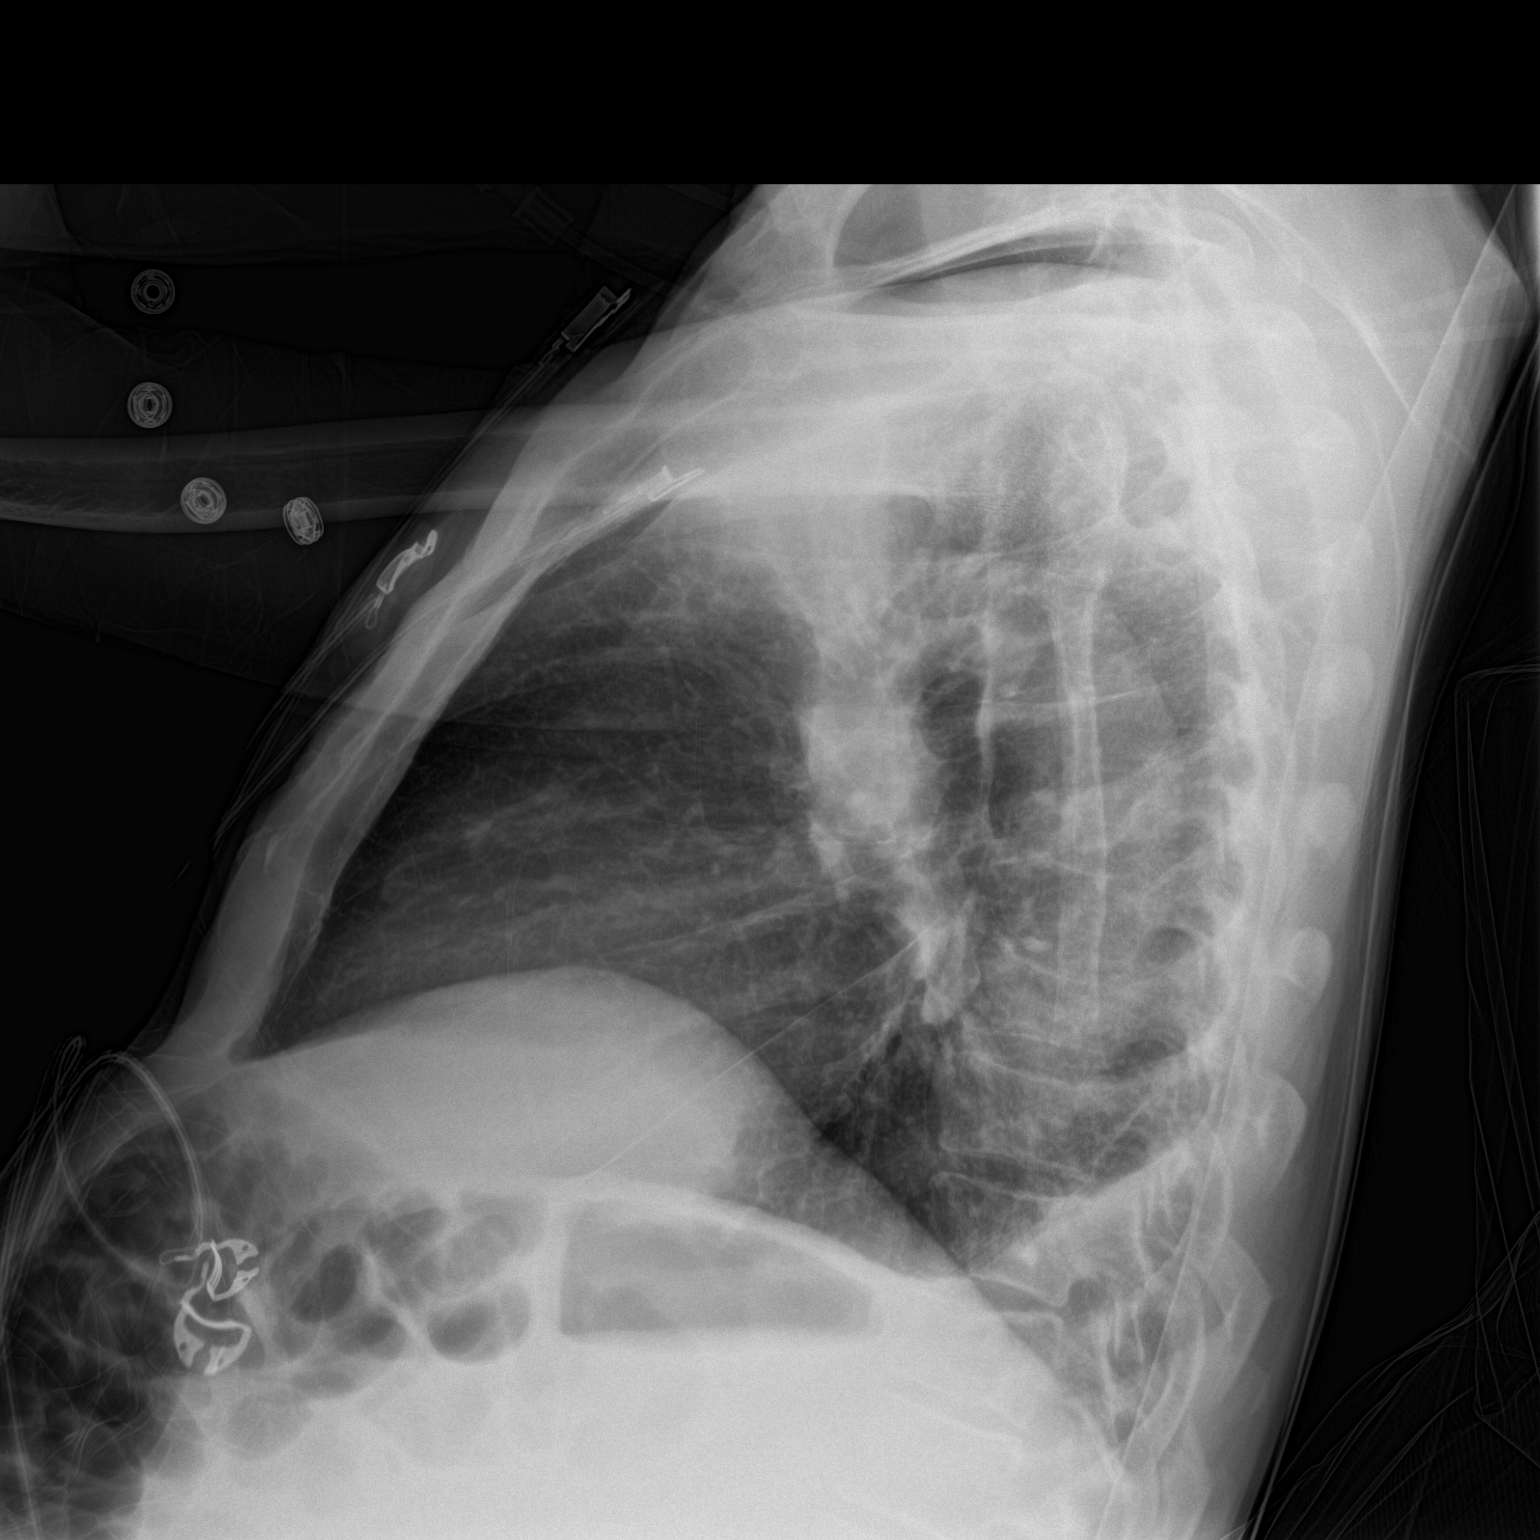

[chest ap]
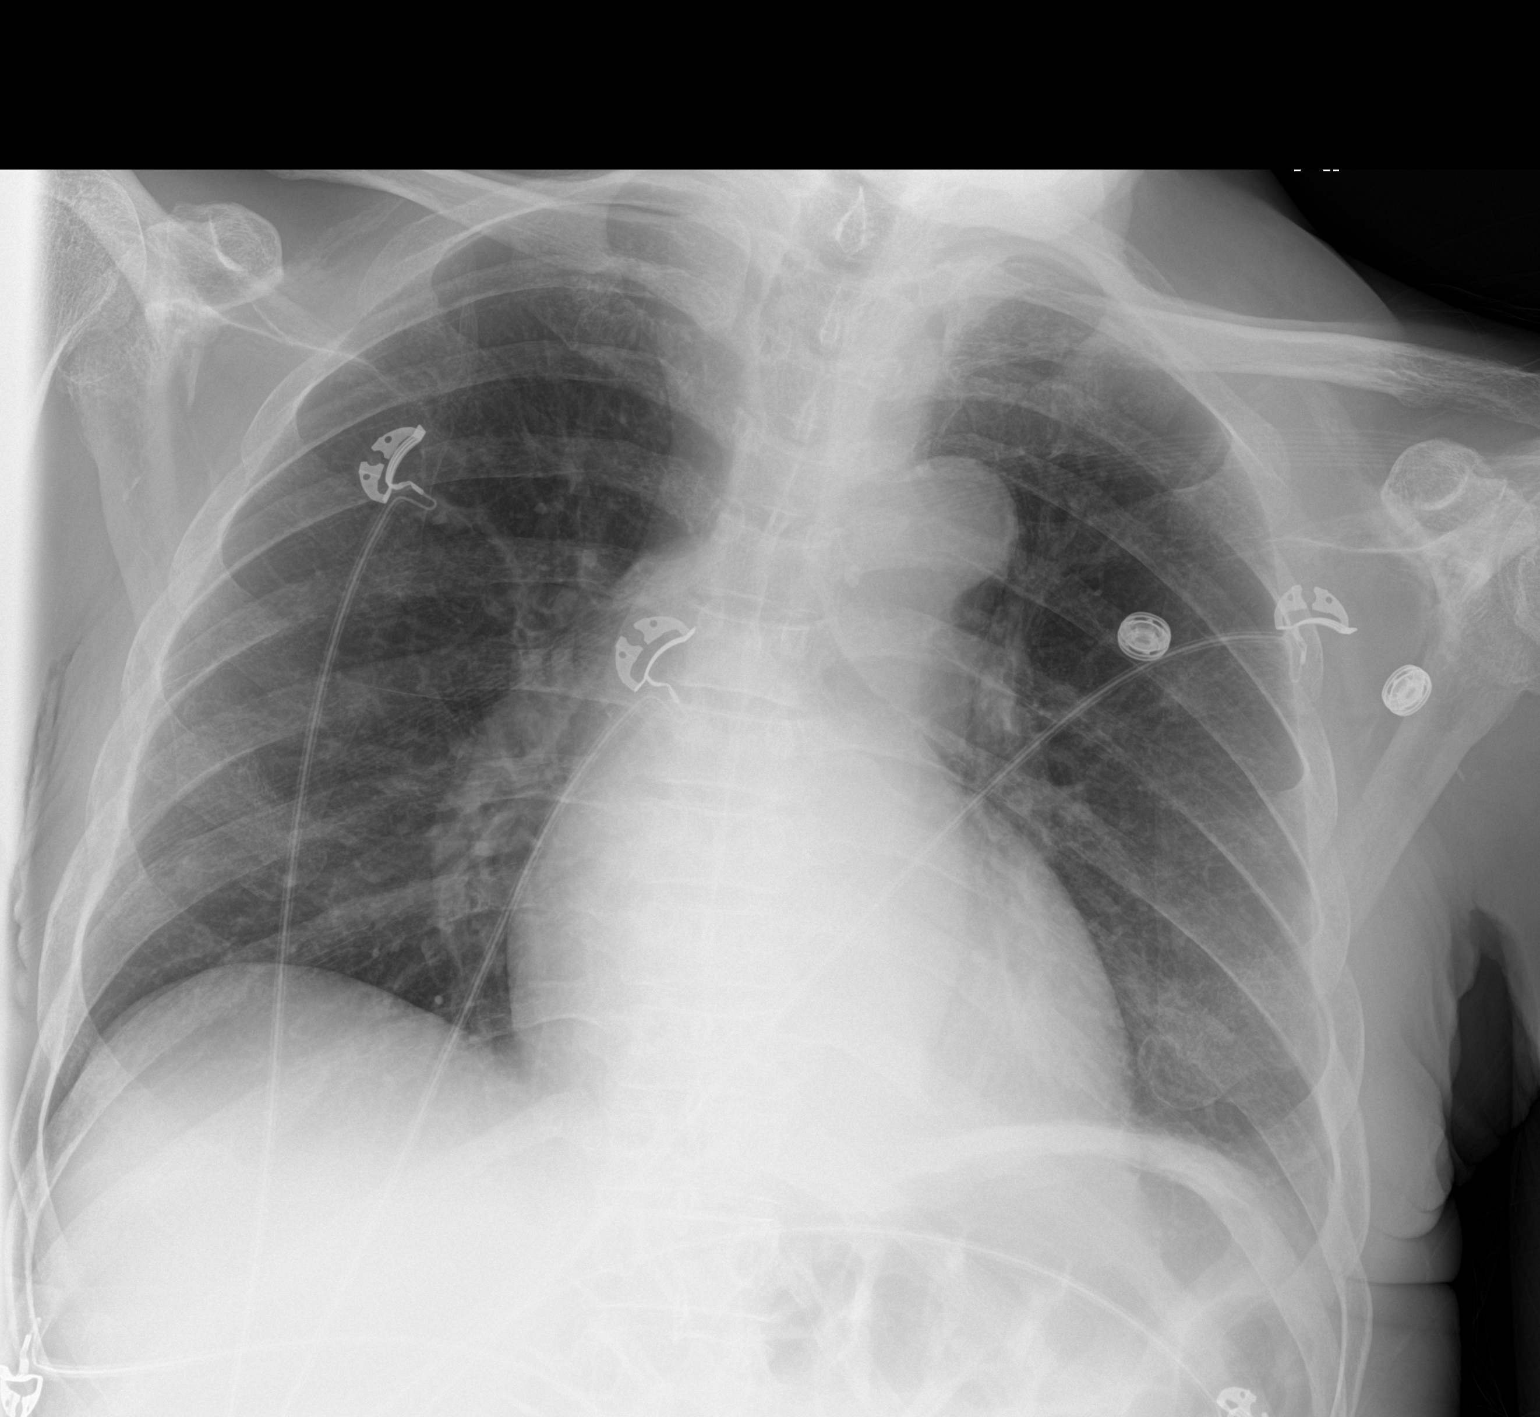

[2 of 2 positions shown; findings below may reference images not displayed]

FINDINGS: The lungs are clear. Heart size is upper normal. No pneumothorax or
pleural effusion. No focal bony abnormality.
IMPRESSION: No acute disease.
